# Patient Record
Sex: Male | Born: 1995 | Race: White | Hispanic: No | Marital: Single | Smoking: Never smoker
Health system: Southern US, Community
[De-identification: ages and names within clinical notes are randomized; demographics above are authoritative.]

## PROBLEM LIST (undated history)

## (undated) DIAGNOSIS — K219 Gastro-esophageal reflux disease without esophagitis: Secondary | ICD-10-CM

## (undated) HISTORY — PX: ADENOIDECTOMY: SHX5191

## (undated) HISTORY — DX: Gastro-esophageal reflux disease without esophagitis: K21.9

---
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
## 2014-03-11 ENCOUNTER — Telehealth: Payer: Self-pay | Admitting: Family Medicine

## 2014-03-15 NOTE — Telephone Encounter (Signed)
Several attempts made to contact patient.

## 2014-03-21 ENCOUNTER — Ambulatory Visit: Payer: Self-pay | Admitting: Family Medicine

## 2014-10-30 ENCOUNTER — Ambulatory Visit (INDEPENDENT_AMBULATORY_CARE_PROVIDER_SITE_OTHER): Payer: 59 | Admitting: Family

## 2014-10-30 ENCOUNTER — Encounter: Payer: Self-pay | Admitting: Family

## 2014-10-30 VITALS — BP 121/69 | HR 97 | Temp 98.3°F | Ht 69.0 in | Wt 189.6 lb

## 2014-10-30 DIAGNOSIS — K279 Peptic ulcer, site unspecified, unspecified as acute or chronic, without hemorrhage or perforation: Secondary | ICD-10-CM | POA: Diagnosis not present

## 2014-10-30 DIAGNOSIS — R1013 Epigastric pain: Secondary | ICD-10-CM | POA: Diagnosis not present

## 2014-10-30 MED ORDER — OMEPRAZOLE 40 MG PO CPDR: 40.0000 mg | DELAYED_RELEASE_CAPSULE | Freq: Every day | ORAL | Status: DC

## 2014-10-30 NOTE — Patient Instructions (Signed)
Peptic Ulcer A peptic ulcer is a sore in the lining of your esophagus (esophageal ulcer), stomach (gastric ulcer), or in the first part of your small intestine (duodenal ulcer). The ulcer causes erosion into the deeper tissue. CAUSES  Normally, the lining of the stomach and the small intestine protects itself from the acid that digests food. The protective lining can be damaged by:  An infection caused by a bacterium called Helicobacter pylori (H. pylori).  Regular use of nonsteroidal anti-inflammatory drugs (NSAIDs), such as ibuprofen or aspirin.  Smoking tobacco. Other risk factors include being older than 50, drinking alcohol excessively, and having a family history of ulcer disease.  SYMPTOMS   Burning pain or gnawing in the area between the chest and the belly button.  Heartburn.  Nausea and vomiting.  Bloating. The pain can be worse on an empty stomach and at night. If the ulcer results in bleeding, it can cause:  Black, tarry stools.  Vomiting of bright red blood.  Vomiting of coffee-ground-looking materials. DIAGNOSIS  A diagnosis is usually made based upon your history and an exam. Other tests and procedures may be performed to find the cause of the ulcer. Finding a cause will help determine the best treatment. Tests and procedures may include:  Blood tests, stool tests, or breath tests to check for the bacterium H. pylori.  An upper gastrointestinal (GI) series of the esophagus, stomach, and small intestine.  An endoscopy to examine the esophagus, stomach, and small intestine.  A biopsy. TREATMENT  Treatment may include:  Eliminating the cause of the ulcer, such as smoking, NSAIDs, or alcohol.  Medicines to reduce the amount of acid in your digestive tract.  Antibiotic medicines if the ulcer is caused by the H. pylori bacterium.  An upper endoscopy to treat a bleeding ulcer.  Surgery if the bleeding is severe or if the ulcer created a hole somewhere in the  digestive system. HOME CARE INSTRUCTIONS   Avoid tobacco, alcohol, and caffeine. Smoking can increase the acid in the stomach, and continued smoking will impair the healing of ulcers.  Avoid foods and drinks that seem to cause discomfort or aggravate your ulcer.  Only take medicines as directed by your caregiver. Do not substitute over-the-counter medicines for prescription medicines without talking to your caregiver.  Keep any follow-up appointments and tests as directed. SEEK MEDICAL CARE IF:   Your do not improve within 7 days of starting treatment.  You have ongoing indigestion or heartburn. SEEK IMMEDIATE MEDICAL CARE IF:   You have sudden, sharp, or persistent abdominal pain.  You have bloody or dark black, tarry stools.  You vomit blood or vomit that looks like coffee grounds.  You become light-headed, weak, or feel faint.  You become sweaty or clammy. MAKE SURE YOU:   Understand these instructions.  Will watch your condition.  Will get help right away if you are not doing well or get worse. Document Released: 07/09/2000 Document Revised: 11/26/2013 Document Reviewed: 02/09/2012 [facility] Patient Information 2015 [place], [place]. This information is not intended to replace advice given to you by your health care provider. Make sure you discuss any questions you have with your health care provider.  Food Choices for Peptic Ulcer Disease When you have peptic ulcer disease, the foods you eat and your eating habits are very important. Choosing the right foods can help ease the discomfort of peptic ulcer disease. WHAT GENERAL GUIDELINES DO I NEED TO FOLLOW?  Choose fruits, vegetables, whole grains, and low-fat meat,  fish, and poultry.   Keep a food diary to identify foods that cause symptoms.  Avoid foods that cause irritation or pain. These may be different for different people.  Eat frequent small meals instead of three large meals each day. The pain may be worse  when your stomach is empty.  Avoid eating close to bedtime. WHAT FOODS ARE NOT RECOMMENDED? The following are some foods and drinks that may worsen your symptoms:  Black, white, and red pepper.  Hot sauce.  Chili peppers.  Chili powder.  Chocolate and cocoa.   Alcohol.  Tea, coffee, and cola (regular and decaffeinated). The items listed above may not be a complete list of foods and beverages to avoid. Contact your dietitian for more information. Document Released: 10/04/2011 Document Revised: 07/17/2013 Document Reviewed: 05/16/2013 [facility] Patient Information 2015 [place], [place]. This information is not intended to replace advice given to you by your health care provider. Make sure you discuss any questions you have with your health care provider.  Peptic Ulcer A peptic ulcer is a sore in the lining of your esophagus (esophageal ulcer), stomach (gastric ulcer), or in the first part of your small intestine (duodenal ulcer). The ulcer causes erosion into the deeper tissue. CAUSES  Normally, the lining of the stomach and the small intestine protects itself from the acid that digests food. The protective lining can be damaged by:  An infection caused by a bacterium called Helicobacter pylori (H. pylori).  Regular use of nonsteroidal anti-inflammatory drugs (NSAIDs), such as ibuprofen or aspirin.  Smoking tobacco. Other risk factors include being older than 50, drinking alcohol excessively, and having a family history of ulcer disease.  SYMPTOMS   Burning pain or gnawing in the area between the chest and the belly button.  Heartburn.  Nausea and vomiting.  Bloating. The pain can be worse on an empty stomach and at night. If the ulcer results in bleeding, it can cause:  Black, tarry stools.  Vomiting of bright red blood.  Vomiting of coffee-ground-looking materials. DIAGNOSIS  A diagnosis is usually made based upon your history and an exam. Other tests and  procedures may be performed to find the cause of the ulcer. Finding a cause will help determine the best treatment. Tests and procedures may include:  Blood tests, stool tests, or breath tests to check for the bacterium H. pylori.  An upper gastrointestinal (GI) series of the esophagus, stomach, and small intestine.  An endoscopy to examine the esophagus, stomach, and small intestine.  A biopsy. TREATMENT  Treatment may include:  Eliminating the cause of the ulcer, such as smoking, NSAIDs, or alcohol.  Medicines to reduce the amount of acid in your digestive tract.  Antibiotic medicines if the ulcer is caused by the H. pylori bacterium.  An upper endoscopy to treat a bleeding ulcer.  Surgery if the bleeding is severe or if the ulcer created a hole somewhere in the digestive system. HOME CARE INSTRUCTIONS   Avoid tobacco, alcohol, and caffeine. Smoking can increase the acid in the stomach, and continued smoking will impair the healing of ulcers.  Avoid foods and drinks that seem to cause discomfort or aggravate your ulcer.  Only take medicines as directed by your caregiver. Do not substitute over-the-counter medicines for prescription medicines without talking to your caregiver.  Keep any follow-up appointments and tests as directed. SEEK MEDICAL CARE IF:   Your do not improve within 7 days of starting treatment.  You have ongoing indigestion or heartburn. SEEK IMMEDIATE  MEDICAL CARE IF:   You have sudden, sharp, or persistent abdominal pain.  You have bloody or dark black, tarry stools.  You vomit blood or vomit that looks like coffee grounds.  You become light-headed, weak, or feel faint.  You become sweaty or clammy. MAKE SURE YOU:   Understand these instructions.  Will watch your condition.  Will get help right away if you are not doing well or get worse. Document Released: 07/09/2000 Document Revised: 11/26/2013 Document Reviewed: 02/09/2012 [facility] Patient  Information 2015 [place], [place]. This information is not intended to replace advice given to you by your health care provider. Make sure you discuss any questions you have with your health care provider.

## 2014-10-30 NOTE — Progress Notes (Signed)
   Subjective:    Patient ID: Charles Greene, male    DOB: September 23, 1995, 19 y.o.   MRN: [id number]  Abdominal Pain This is a new problem. The current episode started in the past 7 days. The onset quality is gradual. The problem occurs intermittently. The problem has been waxing and waning. The pain is located in the epigastric region. The pain is at a severity of 8/10. The pain is moderate. The quality of the pain is sharp. The abdominal pain does not radiate. Pertinent negatives include no belching, constipation, diarrhea, dysuria, headaches, hematochezia, nausea or vomiting. The pain is aggravated by eating. The pain is relieved by nothing. He has tried antacids for the symptoms. The treatment provided no relief. There is no history of GERD.      Review of Systems  Constitutional: Negative.   HENT: Negative.   Respiratory: Negative.   Cardiovascular: Negative.   Gastrointestinal: Positive for abdominal pain. Negative for nausea, vomiting, diarrhea, constipation and hematochezia.  Endocrine: Negative.   Genitourinary: Negative.  Negative for dysuria.  Musculoskeletal: Negative.   Neurological: Negative.  Negative for headaches.  Hematological: Negative.   Psychiatric/Behavioral: Negative.   All other systems reviewed and are negative.      Objective:   Physical Exam  Constitutional: He is oriented to person, place, and time. He appears well-developed and well-nourished. No distress.  HENT:  Head: Normocephalic.  Right Ear: External ear normal.  Left Ear: External ear normal.  Nose: Nose normal.  Mouth/Throat: Oropharynx is clear and moist.  Eyes: Pupils are equal, round, and reactive to light. Right eye exhibits no discharge. Left eye exhibits no discharge.  Neck: Normal range of motion. Neck supple. No thyromegaly present.  Cardiovascular: Normal rate, regular rhythm, normal heart sounds and intact distal pulses.   No murmur heard. Pulmonary/Chest: Effort normal and breath  sounds normal. No respiratory distress. He has no wheezes.  Abdominal: Soft. Bowel sounds are normal. He exhibits no distension. There is no tenderness.  Musculoskeletal: Normal range of motion. He exhibits no edema or tenderness.  Neurological: He is alert and oriented to person, place, and time. He has normal reflexes. No cranial nerve deficit.  Skin: Skin is warm and dry. No rash noted. No erythema.  Psychiatric: He has a normal mood and affect. His behavior is normal. Judgment and thought content normal.  Vitals reviewed.   BP 121/69 mmHg  Pulse 97  Temp(Src) 98.3 F (36.8 C) (Oral)  Ht _0  (1.753 m)  Wt 189 lb 9.6 oz (86.002 kg)  BMI 27.99 kg/m2       Assessment & Plan:  1. Abdominal pain, epigastric - CMP14+EGFR - H Pylori, IGM, IGG, IGA AB  2. Peptic ulcer disease -Food choices and diet discussed -Labs pending -Avoid NSAIDS and alcohol -RTO prn - omeprazole (PRILOSEC) 40 MG capsule; Take 1 capsule (40 mg total) by mouth daily.  Dispense: 90 capsule; Refill: [place], FNP

## 2014-11-01 ENCOUNTER — Other Ambulatory Visit: Payer: Self-pay | Admitting: Family

## 2014-11-01 DIAGNOSIS — A048 Other specified bacterial intestinal infections: Secondary | ICD-10-CM

## 2014-11-01 LAB — CMP14+EGFR
ALT: 20 IU/L (ref 0–44)
AST: 17 IU/L (ref 0–40)
Albumin/Globulin Ratio: 1.7 (ref 1.1–2.5)
Albumin: 4.3 g/dL (ref 3.5–5.5)
Alkaline Phosphatase: 62 IU/L (ref 39–117)
BUN/Creatinine Ratio: 10 (ref 8–19)
BUN: 11 mg/dL (ref 6–20)
Bilirubin Total: 0.5 mg/dL (ref 0.0–1.2)
CALCIUM: 9.1 mg/dL (ref 8.7–10.2)
CHLORIDE: 97 mmol/L (ref 97–108)
CO2: 24 mmol/L (ref 18–29)
CREATININE: 1.05 mg/dL (ref 0.76–1.27)
GFR calc Af Amer: 118 mL/min/{1.73_m2} (ref >59)
GFR calc non Af Amer: 102 mL/min/{1.73_m2} (ref >59)
GLUCOSE: 84 mg/dL (ref 65–99)
Globulin, Total: 2.6 g/dL (ref 1.5–4.5)
Potassium: 4.1 mmol/L (ref 3.5–5.2)
Sodium: 138 mmol/L (ref 134–144)
Total Protein: 6.9 g/dL (ref 6.0–8.5)

## 2014-11-01 LAB — H PYLORI, IGM, IGG, IGA AB
H Pylori IgG: <0.9 U/mL (ref 0.0–0.8)
H. PYLORI, IGM ABS: 10.9 U — AB (ref 0.0–8.9)
H. pylori, IgA Abs: <9 units (ref 0.0–8.9)

## 2014-11-01 MED ORDER — METRONIDAZOLE 500 MG PO TABS
500.0000 mg | ORAL_TABLET | Freq: Four times a day (QID) | ORAL | Status: DC
Start: 2014-11-01 — End: 2015-12-15

## 2014-11-01 MED ORDER — TETRACYCLINE HCL 500 MG PO CAPS: 500.0000 mg | ORAL_CAPSULE | Freq: Four times a day (QID) | ORAL | Status: DC

## 2014-11-01 MED ORDER — BISMUTH SUBSALICYLATE 525 MG/15ML PO SUSP: 15.0000 mL | Freq: Four times a day (QID) | ORAL | Status: DC

## 2014-11-15 ENCOUNTER — Ambulatory Visit (INDEPENDENT_AMBULATORY_CARE_PROVIDER_SITE_OTHER): Payer: 59 | Admitting: Family

## 2014-11-15 ENCOUNTER — Encounter: Payer: Self-pay | Admitting: Family

## 2014-11-15 VITALS — BP 123/71 | HR 62 | Temp 97.8°F | Resp 18 | Wt 180.0 lb

## 2014-11-15 DIAGNOSIS — K279 Peptic ulcer, site unspecified, unspecified as acute or chronic, without hemorrhage or perforation: Secondary | ICD-10-CM

## 2014-11-15 DIAGNOSIS — B9681 Helicobacter pylori [H. pylori] as the cause of diseases classified elsewhere: Secondary | ICD-10-CM | POA: Diagnosis not present

## 2014-11-15 DIAGNOSIS — A048 Other specified bacterial intestinal infections: Secondary | ICD-10-CM

## 2014-11-15 MED ORDER — OMEPRAZOLE 40 MG PO CPDR: 40.0000 mg | DELAYED_RELEASE_CAPSULE | Freq: Every day | ORAL | Status: DC

## 2014-11-15 NOTE — Progress Notes (Signed)
Subjective:    Patient ID: Charles Greene, male    DOB: Nov 18, 1995, 19 y.o.   MRN: [id number]  HPI Pt presents to the office today for follow up on abd pain. Pt was diagnosed with H pylori. Pt was treated with flagyn, tetracycline and started on omeprazole. PT states he finished the antibiotics, but has stopped the omeprazole because he thought he was finished with that. Pt states he is having some nausea and had one episode of vomiting last night. Pt states it was not a large amount but it was a brownish orange color. Pt states  Pt states he is still having intermittent epigastric pain of a 8 out 10. Pt denies any blood in urine, stool or emesis.    Review of Systems  Constitutional: Negative.   HENT: Negative.   Respiratory: Negative.   Cardiovascular: Negative.   Gastrointestinal: Negative.   Endocrine: Negative.   Genitourinary: Negative.   Musculoskeletal: Negative.   Neurological: Negative.   Hematological: Negative.   Psychiatric/Behavioral: Negative.   All other systems reviewed and are negative.      Objective:   Physical Exam  Constitutional: He is oriented to person, place, and time. He appears well-developed and well-nourished. No distress.  HENT:  Head: Normocephalic.  Eyes: Pupils are equal, round, and reactive to light. Right eye exhibits no discharge. Left eye exhibits no discharge.  Neck: Normal range of motion. Neck supple. No thyromegaly present.  Cardiovascular: Normal rate, regular rhythm, normal heart sounds and intact distal pulses.   No murmur heard. Pulmonary/Chest: Effort normal and breath sounds normal. No respiratory distress. He has no wheezes.  Abdominal: Soft. Bowel sounds are normal. He exhibits no distension. There is tenderness (Mild LUQ pain).  Musculoskeletal: Normal range of motion. He exhibits no edema or tenderness.  Neurological: He is alert and oriented to person, place, and time. He has normal reflexes. No cranial nerve deficit.    Skin: Skin is warm and dry. No rash noted. No erythema.  Psychiatric: He has a normal mood and affect. His behavior is normal. Judgment and thought content normal.  Vitals reviewed.   BP 123/71 mmHg  Pulse 62  Temp(Src) 97.8 F (36.6 C)  Resp 18  Wt 180 lb (81.647 kg)       Assessment & Plan:  1. H. pylori infection - CMP14+EGFR - H Pylori, IGM, IGG, IGA AB  2. Peptic ulcer disease - CMP14+EGFR - H Pylori, IGM, IGG, IGA AB - omeprazole (PRILOSEC) 40 MG capsule; Take 1 capsule (40 mg total) by mouth daily.  Dispense: 90 capsule; Refill: 3  Food choices for peptic ulcer disease dicussed  Pt to continue taking Omeprazole daily RTO 2 weeks to recheck  Evelina Dun, FNP

## 2014-11-15 NOTE — Patient Instructions (Addendum)
Peptic Ulcer A peptic ulcer is a sore in the lining of your esophagus (esophageal ulcer), stomach (gastric ulcer), or in the first part of your small intestine (duodenal ulcer). The ulcer causes erosion into the deeper tissue. CAUSES  Normally, the lining of the stomach and the small intestine protects itself from the acid that digests food. The protective lining can be damaged by:  An infection caused by a bacterium called Helicobacter pylori (H. pylori).  Regular use of nonsteroidal anti-inflammatory drugs (NSAIDs), such as ibuprofen or aspirin.  Smoking tobacco. Other risk factors include being older than 50, drinking alcohol excessively, and having a family history of ulcer disease.  SYMPTOMS   Burning pain or gnawing in the area between the chest and the belly button.  Heartburn.  Nausea and vomiting.  Bloating. The pain can be worse on an empty stomach and at night. If the ulcer results in bleeding, it can cause:  Black, tarry stools.  Vomiting of bright red blood.  Vomiting of coffee-ground-looking materials. DIAGNOSIS  A diagnosis is usually made based upon your history and an exam. Other tests and procedures may be performed to find the cause of the ulcer. Finding a cause will help determine the best treatment. Tests and procedures may include:  Blood tests, stool tests, or breath tests to check for the bacterium H. pylori.  An upper gastrointestinal (GI) series of the esophagus, stomach, and small intestine.  An endoscopy to examine the esophagus, stomach, and small intestine.  A biopsy. TREATMENT  Treatment may include:  Eliminating the cause of the ulcer, such as smoking, NSAIDs, or alcohol.  Medicines to reduce the amount of acid in your digestive tract.  Antibiotic medicines if the ulcer is caused by the H. pylori bacterium.  An upper endoscopy to treat a bleeding ulcer.  Surgery if the bleeding is severe or if the ulcer created a hole somewhere in the  digestive system. HOME CARE INSTRUCTIONS   Avoid tobacco, alcohol, and caffeine. Smoking can increase the acid in the stomach, and continued smoking will impair the healing of ulcers.  Avoid foods and drinks that seem to cause discomfort or aggravate your ulcer.  Only take medicines as directed by your caregiver. Do not substitute over-the-counter medicines for prescription medicines without talking to your caregiver.  Keep any follow-up appointments and tests as directed. SEEK MEDICAL CARE IF:   Your do not improve within 7 days of starting treatment.  You have ongoing indigestion or heartburn. SEEK IMMEDIATE MEDICAL CARE IF:   You have sudden, sharp, or persistent abdominal pain.  You have bloody or dark black, tarry stools.  You vomit blood or vomit that looks like coffee grounds.  You become light-headed, weak, or feel faint.  You become sweaty or clammy. MAKE SURE YOU:   Understand these instructions.  Will watch your condition.  Will get help right away if you are not doing well or get worse. Document Released: 07/09/2000 Document Revised: 11/26/2013 Document Reviewed: 02/09/2012 [facility] Patient Information 2015 [place], [place]. This information is not intended to replace advice given to you by your health care provider. Make sure you discuss any questions you have with your health care provider. Helicobacter Pylori Antibodies Test This is a blood test which looks for a germ called Helicobacter pylori. This can also be diagnosed by a breath test or a microscopic examination of a portion (biopsy) of the small bowel. H. pylori is a germ that is found in the cells that line the stomach. It  is a risk factor for stomach and small bowel ulcers, long-standing inflammation of the lining of the stomach, or even ulcers that may occur in the esophagus (the canal that runs from the mouth to the stomach). This bacterium is also a factor in stomach cancer. The amount of the bacteria is  found in about 10% of healthy persons younger than 21 years of age and the amount of the bacteria increases with age. Most persons with these bacteria have no symptoms; however, it is thought that when these bacteria cause ulcers, antibiotic medications can be used to help eliminate or reduce the problem.  PREPARATION FOR TEST No preparation or fasting is necessary. NORMAL FINDINGS Negative (H. pylori bacteria not present). Ranges for normal findings may vary among different laboratories and hospitals. You should always check with your doctor after having lab work or other tests done to discuss the meaning of your test results and whether or not your values are considered within normal limits. MEANING OF TEST  Your caregiver will go over the test results with you and discuss the importance and meaning of your results, as well as treatment options and the need for additional tests if necessary. OBTAINING THE TEST RESULTS It is your responsibility to obtain your test results. Ask the lab or department performing the test when and how you will get your results. Document Released: 08/05/2004 Document Revised: 11/26/2013 Document Reviewed: 06/22/2008 [facility] Patient Information 2015 [place], [place]. This information is not intended to replace advice given to you by your health care provider. Make sure you discuss any questions you have with your health care provider. Food Choices for Peptic Ulcer Disease When you have peptic ulcer disease, the foods you eat and your eating habits are very important. Choosing the right foods can help ease the discomfort of peptic ulcer disease. WHAT GENERAL GUIDELINES DO I NEED TO FOLLOW?  Choose fruits, vegetables, whole grains, and low-fat meat, fish, and poultry.   Keep a food diary to identify foods that cause symptoms.  Avoid foods that cause irritation or pain. These may be different for different people.  Eat frequent small meals instead of three large meals  each day. The pain may be worse when your stomach is empty.  Avoid eating close to bedtime. WHAT FOODS ARE NOT RECOMMENDED? The following are some foods and drinks that may worsen your symptoms:  Black, white, and red pepper.  Hot sauce.  Chili peppers.  Chili powder.  Chocolate and cocoa.   Alcohol.  Tea, coffee, and cola (regular and decaffeinated). The items listed above may not be a complete list of foods and beverages to avoid. Contact your dietitian for more information. Document Released: 10/04/2011 Document Revised: 07/17/2013 Document Reviewed: 05/16/2013 [facility] Patient Information 2015 [place], [place]. This information is not intended to replace advice given to you by your health care provider. Make sure you discuss any questions you have with your health care provider.

## 2014-11-19 LAB — CMP14+EGFR
ALBUMIN: 4.6 g/dL (ref 3.5–5.5)
ALK PHOS: 60 IU/L (ref 39–117)
ALT: 30 IU/L (ref 0–44)
AST: 25 IU/L (ref 0–40)
Albumin/Globulin Ratio: 1.9 (ref 1.1–2.5)
BUN/Creatinine Ratio: 12 (ref 8–19)
BUN: 14 mg/dL (ref 6–20)
Bilirubin Total: 0.6 mg/dL (ref 0.0–1.2)
CALCIUM: 9.6 mg/dL (ref 8.7–10.2)
CHLORIDE: 99 mmol/L (ref 97–108)
CO2: 26 mmol/L (ref 18–29)
CREATININE: 1.15 mg/dL (ref 0.76–1.27)
GFR calc non Af Amer: 92 mL/min/{1.73_m2} (ref >59)
GFR, EST AFRICAN AMERICAN: 106 mL/min/{1.73_m2} (ref >59)
GLOBULIN, TOTAL: 2.4 g/dL (ref 1.5–4.5)
Glucose: 97 mg/dL (ref 65–99)
POTASSIUM: 4.8 mmol/L (ref 3.5–5.2)
SODIUM: 140 mmol/L (ref 134–144)
Total Protein: 7 g/dL (ref 6.0–8.5)

## 2014-11-19 LAB — H PYLORI, IGM, IGG, IGA AB: H. pylori, IgM Abs: <9 units (ref 0.0–8.9)

## 2015-12-15 ENCOUNTER — Encounter: Payer: Self-pay | Admitting: Physician Assistant

## 2015-12-15 ENCOUNTER — Ambulatory Visit (INDEPENDENT_AMBULATORY_CARE_PROVIDER_SITE_OTHER): Payer: BLUE CROSS/BLUE SHIELD | Admitting: Physician Assistant

## 2015-12-15 VITALS — BP 125/67 | HR 83 | Temp 97.9°F | Ht 69.0 in | Wt 200.6 lb

## 2015-12-15 DIAGNOSIS — K219 Gastro-esophageal reflux disease without esophagitis: Secondary | ICD-10-CM

## 2015-12-15 NOTE — Progress Notes (Signed)
Subjective:     Patient ID: Charles Greene Y Greene, male   DOB: 05-Nov-1995, 20 y.o.   MRN: [id number]  HPI Pt with epigastric pain for several days + hx of same No currently taking any meds for sx Hx of Hpylori and Prilosec use Denies ETOH use but does dip snuff and has caff intake Food seems to make sx worse  Review of Systems  Constitutional: Negative.   HENT: Negative.   Respiratory: Negative.   Gastrointestinal: Positive for nausea, abdominal pain and diarrhea. Negative for vomiting, constipation and blood in stool.       Objective:   Physical Exam  Constitutional: He appears well-developed and well-nourished.  HENT:  Mouth/Throat: Oropharynx is clear and moist.  Neck: Neck supple.  Cardiovascular: Normal rate, regular rhythm and normal heart sounds.   Pulmonary/Chest: Effort normal and breath sounds normal.  Abdominal: Soft. He exhibits no distension and no mass. There is no tenderness. There is no rebound and no guarding.  Lymphadenopathy:    He has no cervical adenopathy.  Nursing note and vitals reviewed.      Assessment:     1. Gastroesophageal reflux disease, esophagitis presence not specified        Plan:     Decrease caff intake Cut snuff use Hold any OTC NSAIDS OTC Zantac If sx continue f/u

## 2015-12-15 NOTE — Patient Instructions (Signed)

## 2016-05-31 ENCOUNTER — Encounter (HOSPITAL_COMMUNITY): Payer: Self-pay | Admitting: Emergency Medicine

## 2016-05-31 ENCOUNTER — Ambulatory Visit (HOSPITAL_COMMUNITY)
Admission: EM | Admit: 2016-05-31 | Discharge: 2016-05-31 | Disposition: A | Discharge status: Home / Self Care | Payer: Commercial Managed Care - HMO | Attending: Family Medicine | Admitting: Family Medicine

## 2016-05-31 DIAGNOSIS — R0789 Other chest pain: Secondary | ICD-10-CM

## 2016-05-31 MED ORDER — MELOXICAM 7.5 MG PO TABS: 7.5000 mg | ORAL_TABLET | Freq: Two times a day (BID) | ORAL | 1 refills | Status: DC

## 2016-05-31 NOTE — ED Provider Notes (Signed)
[facility]    CSN: [id number] Arrival date & time: 05/31/16  1806     History   Chief Complaint Chief Complaint  Patient presents with  . Chest Pain    HPI Charles Greene is a 20 y.o. male.   The history is provided by the patient and a parent.  Chest Pain  Pain location:  R chest Pain quality: sharp and stabbing   Pain radiates to:  Does not radiate Pain severity:  Mild Onset quality:  Sudden Progression:  Unchanged Chronicity:  New Context: movement   Context comment:  Moving equip at work and laid down to continue job and pain developed, continues. Relieved by:  None tried Worsened by:  Movement and deep breathing Ineffective treatments:  None tried Associated symptoms: no abdominal pain, no cough, no fever and no palpitations     History reviewed. No pertinent past medical history.  Patient Active Problem List   Diagnosis Date Noted  . H. pylori infection 11/15/2014    History reviewed. No pertinent surgical history.     Home Medications    Prior to Admission medications   Medication Sig Start Date End Date Taking? Authorizing Provider  omeprazole (PRILOSEC) 40 MG capsule Take 1 capsule (40 mg total) by mouth daily. Patient not taking: Reported on 12/15/2015 11/15/14   Junie Spencerhristy A Hawks, FNP    Family History History reviewed. No pertinent family history.  Social History Social History  Substance Use Topics  . Smoking status: Never Smoker  . Smokeless tobacco: Current User    Types: Chew  . Alcohol use No     Allergies   Patient has no known allergies.   Review of Systems Review of Systems  Constitutional: Negative.  Negative for fever.  HENT: Negative.   Respiratory: Negative.  Negative for cough.   Cardiovascular: Positive for chest pain. Negative for palpitations and leg swelling.  Gastrointestinal: Negative.  Negative for abdominal pain.  All other systems reviewed and are negative.    Physical Exam Triage Vital  Signs ED Triage Vitals  Enc Vitals Group     BP 05/31/16 1829 131/68     Pulse Rate 05/31/16 1829 80     Resp 05/31/16 1829 16     Temp 05/31/16 1829 99.1 F (37.3 C)     Temp Source 05/31/16 1829 Oral     SpO2 05/31/16 1829 100 %     Weight --      Height --      Head Circumference --      Peak Flow --      Pain Score 05/31/16 1844 6     Pain Loc --      Pain Edu? --      Excl. in GC? --    No data found.   Updated Vital Signs BP 131/68 (BP Location: Left Arm)   Pulse 80   Temp 99.1 F (37.3 C) (Oral)   Resp 16   SpO2 100%   Visual Acuity Right Eye Distance:   Left Eye Distance:   Bilateral Distance:    Right Eye Near:   Left Eye Near:    Bilateral Near:     Physical Exam  Constitutional: He is oriented to person, place, and time. He appears well-developed and well-nourished. He appears distressed.  Neck: Normal range of motion. Neck supple.  Cardiovascular: Normal rate, regular rhythm, normal heart sounds and intact distal pulses.   Pulmonary/Chest: Effort normal and breath sounds normal. He exhibits  tenderness.  Abdominal: Soft. Bowel sounds are normal.  Lymphadenopathy:    He has no cervical adenopathy.  Neurological: He is alert and oriented to person, place, and time. No cranial nerve deficit.  Skin: Skin is warm and dry.  Nursing note and vitals reviewed.    UC Treatments / Results  Labs (all labs ordered are listed, but only abnormal results are displayed) Labs Reviewed - No data to display  EKG ED ECG REPORT   Date: 05/31/2016  Rate: 82  Rhythm: normal sinus rhythm  QRS Axis: normal  Intervals: normal  ST/T Wave abnormalities: normal  Conduction Disutrbances:none  Narrative Interpretation:   Old EKG Reviewed: none available  I have personally reviewed the EKG tracing and agree with the computerized printout as noted.   Radiology No results found.  Procedures Procedures (including critical care time)  Medications Ordered in  UC Medications - No data to display   Initial Impression / Assessment and Plan / UC Course  I have reviewed the triage vital signs and the nursing notes.  Pertinent labs & imaging results that were available during my care of the patient were reviewed by me and considered in my medical decision making (see chart for details).  Clinical Course       Final Clinical Impressions(s) / UC Diagnoses   Final diagnoses:  None    New Prescriptions New Prescriptions   No medications on file     Linna HoffJames D Talley Kreiser, MD 05/31/16 [id number]

## 2016-05-31 NOTE — ED Triage Notes (Signed)
[facility] a [facility] of right sided chest pain that started at work today. The patient reported that he was moving a piece of equipment and after laying down on the ground he started to experience the chest pain. The patient denied any SOB and reported increased pain with movement and inspiration.

## 2016-09-15 DIAGNOSIS — J039 Acute tonsillitis, unspecified: Secondary | ICD-10-CM | POA: Diagnosis not present

## 2017-04-21 ENCOUNTER — Ambulatory Visit (INDEPENDENT_AMBULATORY_CARE_PROVIDER_SITE_OTHER): Payer: 59 | Admitting: Family

## 2017-04-21 ENCOUNTER — Encounter: Payer: Self-pay | Admitting: *Deleted

## 2017-04-21 ENCOUNTER — Encounter: Payer: Self-pay | Admitting: Family

## 2017-04-21 VITALS — BP 113/61 | HR 74 | Temp 98.6°F | Ht 69.0 in | Wt 201.0 lb

## 2017-04-21 DIAGNOSIS — E663 Overweight: Secondary | ICD-10-CM

## 2017-04-21 DIAGNOSIS — M5441 Lumbago with sciatica, right side: Secondary | ICD-10-CM

## 2017-04-21 MED ORDER — CYCLOBENZAPRINE HCL 5 MG PO TABS: 5.0000 mg | ORAL_TABLET | Freq: Three times a day (TID) | ORAL | 2 refills | Status: DC | PRN

## 2017-04-21 MED ORDER — NAPROXEN 500 MG PO TABS: 500.0000 mg | ORAL_TABLET | Freq: Two times a day (BID) | ORAL | 1 refills | Status: DC

## 2017-04-21 NOTE — Progress Notes (Signed)
   Subjective:    Patient ID: Charles Greene, male    DOB: July 20, 1996, 21 y.o.   MRN: [id number]  Back Pain  This is a new problem. The current episode started more than 1 month ago. The problem occurs intermittently. The problem has been waxing and waning since onset. The pain is present in the lumbar spine. The quality of the pain is described as aching. The pain radiates to the right thigh. The pain is at a severity of 8/10. The pain is moderate. Associated symptoms include leg pain. Pertinent negatives include no bladder incontinence, bowel incontinence, numbness or tingling. Risk factors include obesity. He has tried nothing for the symptoms. The treatment provided no relief.      Review of Systems  Gastrointestinal: Negative for bowel incontinence.  Genitourinary: Negative for bladder incontinence.  Musculoskeletal: Positive for back pain.  Neurological: Negative for tingling and numbness.  All other systems reviewed and are negative.      Objective:   Physical Exam  Constitutional: He is oriented to person, place, and time. He appears well-developed and well-nourished. No distress.  HENT:  Head: Normocephalic.  Eyes: Pupils are equal, round, and reactive to light. Right eye exhibits no discharge. Left eye exhibits no discharge.  Neck: Normal range of motion. Neck supple. No thyromegaly present.  Cardiovascular: Normal rate, regular rhythm, normal heart sounds and intact distal pulses.   No murmur heard. Pulmonary/Chest: Effort normal and breath sounds normal. No respiratory distress. He has no wheezes.  Abdominal: Soft. Bowel sounds are normal. He exhibits no distension. There is no tenderness.  Musculoskeletal: Normal range of motion. He exhibits no edema or tenderness.  Negative SLR, tenderness in lower lumbar with walking  Neurological: He is alert and oriented to person, place, and time.  Skin: Skin is warm and dry. No rash noted. No erythema.  Psychiatric: He has a  normal mood and affect. His behavior is normal. Judgment and thought content normal.  Vitals reviewed.     BP 113/61 (BP Location: Left Arm)   Pulse 74   Temp 98.6 F (37 C) (Oral)   Ht  (1.753 m)   Wt 201 lb (91.2 kg)   BMI 29.68 kg/m      Assessment & Plan:  1. Acute right-sided low back pain with right-sided sciatica Rest Ice  ROM exercises discussed Sedation precautions discussed RTO prn or if pain worsens or does not improve - naproxen (NAPROSYN) 500 MG tablet; Take 1 tablet (500 mg total) by mouth 2 (two) times daily with a meal.  Dispense: 60 tablet; Refill: 1 - cyclobenzaprine (FLEXERIL) 5 MG tablet; Take 1 tablet (5 mg total) by mouth 3 (three) times daily as needed for muscle spasms.  Dispense: 45 tablet; Refill: 2  2. Overweight (BMI 25.0-29.9)   Jannifer Rodney, FNP

## 2017-04-21 NOTE — Patient Instructions (Signed)
Sciatica Sciatica is pain, numbness, weakness, or tingling along the path of the sciatic nerve. The sciatic nerve starts in the lower back and runs down the back of each leg. The nerve controls the muscles in the lower leg and in the back of the knee. It also provides feeling (sensation) to the back of the thigh, the lower leg, and the sole of the foot. Sciatica is a symptom of another medical condition that pinches or puts pressure on the sciatic nerve. Generally, sciatica only affects one side of the body. Sciatica usually goes away on its own or with treatment. In some cases, sciatica may keep coming back (recur). What are the causes? This condition is caused by pressure on the sciatic nerve, or pinching of the sciatic nerve. This may be the result of:  A disk in between the bones of the spine (vertebrae) bulging out too far (herniated disk).  Age-related changes in the spinal disks (degenerative disk disease).  A pain disorder that affects a muscle in the buttock (piriformis syndrome).  Extra bone growth (bone spur) near the sciatic nerve.  An injury or break (fracture) of the pelvis.  Pregnancy.  Tumor (rare). What increases the risk? The following factors may make you more likely to develop this condition:  Playing sports that place pressure or stress on the spine, such as football or weight lifting.  Having poor strength and flexibility.  A history of back injury.  A history of back surgery.  Sitting for long periods of time.  Doing activities that involve repetitive bending or lifting.  Obesity. What are the signs or symptoms? Symptoms can vary from mild to very severe, and they may include:  Any of these problems in the lower back, leg, hip, or buttock:  Mild tingling or dull aches.  Burning sensations.  Sharp pains.  Numbness in the back of the calf or the sole of the foot.  Leg weakness.  Severe back pain that makes movement difficult. These symptoms may  get worse when you cough, sneeze, or laugh, or when you sit or stand for long periods of time. Being overweight may also make symptoms worse. In some cases, symptoms may recur over time. How is this diagnosed? This condition may be diagnosed based on:  Your symptoms.  A physical exam. Your health care provider may ask you to do certain movements to check whether those movements trigger your symptoms.  You may have tests, including:  Blood tests.  X-rays.  MRI.  CT scan. How is this treated? In many cases, this condition improves on its own, without any treatment. However, treatment may include:  Reducing or modifying physical activity during periods of pain.  Exercising and stretching to strengthen your abdomen and improve the flexibility of your spine.  Icing and applying heat to the affected area.  Medicines that help:  To relieve pain and swelling.  To relax your muscles.  Injections of medicines that help to relieve pain, irritation, and inflammation around the sciatic nerve (steroids).  Surgery. Follow these instructions at home: Medicines   Take over-the-counter and prescription medicines only as told by your health care provider.  Do not drive or operate heavy machinery while taking prescription pain medicine. Managing pain   If directed, apply ice to the affected area.  Put ice in a plastic bag.  Place a towel between your skin and the bag.  Leave the ice on for 20 minutes, 2-3 times a day.  After icing, apply heat to the   affected area before you exercise or as often as told by your health care provider. Use the heat source that your health care provider recommends, such as a moist heat pack or a heating pad.  Place a towel between your skin and the heat source.  Leave the heat on for 20-30 minutes.  Remove the heat if your skin turns bright red. This is especially important if you are unable to feel pain, heat, or cold. You may have a greater risk of  getting burned. Activity   Return to your normal activities as told by your health care provider. Ask your health care provider what activities are safe for you.  Avoid activities that make your symptoms worse.  Take brief periods of rest throughout the day. Resting in a lying or standing position is usually better than sitting to rest.  When you rest for longer periods, mix in some mild activity or stretching between periods of rest. This will help to prevent stiffness and pain.  Avoid sitting for long periods of time without moving. Get up and move around at least one time each hour.  Exercise and stretch regularly, as told by your health care provider.  Do not lift anything that is heavier than 10 lb (4.5 kg) while you have symptoms of sciatica. When you do not have symptoms, you should still avoid heavy lifting, especially repetitive heavy lifting.  When you lift objects, always use proper lifting technique, which includes:  Bending your knees.  Keeping the load close to your body.  Avoiding twisting. General instructions   Use good posture.  Avoid leaning forward while sitting.  Avoid hunching over while standing.  Maintain a healthy weight. Excess weight puts extra stress on your back and makes it difficult to maintain good posture.  Wear supportive, comfortable shoes. Avoid wearing high heels.  Avoid sleeping on a mattress that is too soft or too hard. A mattress that is firm enough to support your back when you sleep may help to reduce your pain.  Keep all follow-up visits as told by your health care provider. This is important. Contact a health care provider if:  You have pain that wakes you up when you are sleeping.  You have pain that gets worse when you lie down.  Your pain is worse than you have experienced in the past.  Your pain lasts longer than 4 weeks.  You experience unexplained weight loss. Get help right away if:  You lose control of your bowel  or bladder (incontinence).  You have:  Weakness in your lower back, pelvis, buttocks, or legs that gets worse.  Redness or swelling of your back.  A burning sensation when you urinate. This information is not intended to replace advice given to you by your health care provider. Make sure you discuss any questions you have with your health care provider. Document Released: 07/06/2001 Document Revised: 12/16/2015 Document Reviewed: 03/21/2015 Elsevier Interactive Patient Education  2017 [employer]  

## 2017-09-16 ENCOUNTER — Ambulatory Visit: Payer: 59 | Admitting: Physician Assistant

## 2017-09-19 ENCOUNTER — Encounter: Payer: Self-pay | Admitting: Family

## 2017-10-13 ENCOUNTER — Ambulatory Visit (INDEPENDENT_AMBULATORY_CARE_PROVIDER_SITE_OTHER): Payer: Worker's Compensation

## 2017-10-13 ENCOUNTER — Ambulatory Visit (HOSPITAL_COMMUNITY)
Admission: EM | Admit: 2017-10-13 | Discharge: 2017-10-13 | Disposition: A | Discharge status: Home / Self Care | Payer: Worker's Compensation | Attending: Family Medicine | Admitting: Family Medicine

## 2017-10-13 ENCOUNTER — Encounter (HOSPITAL_COMMUNITY): Payer: Self-pay | Admitting: Family Medicine

## 2017-10-13 DIAGNOSIS — S60221A Contusion of right hand, initial encounter: Secondary | ICD-10-CM

## 2017-10-13 MED ORDER — DICLOFENAC SODIUM 75 MG PO TBEC: 75.0000 mg | DELAYED_RELEASE_TABLET | Freq: Two times a day (BID) | ORAL | 0 refills | Status: DC

## 2017-10-13 NOTE — ED Triage Notes (Signed)
Pt here for right hand injury. sts that he injured it at work by hitting it on a hydraulic. Slight swelling to dorsal aspect of hand. He sts fingers are numb.

## 2017-10-13 NOTE — ED Provider Notes (Signed)
[facility]   [id number] 10/13/17 Arrival Time: 1733  ASSESSMENT & PLAN:  1. Contusion of right hand, initial encounter    Imaging: Dg Hand Complete Right  Result Date: 10/13/2017 CLINICAL DATA:  Pain post blunt trauma. EXAM: RIGHT HAND - COMPLETE 3+ VIEW COMPARISON:  None. FINDINGS: There is no evidence of fracture or dislocation. There is no evidence of arthropathy or other focal bone abnormality. Soft tissues are unremarkable. IMPRESSION: Negative. Electronically Signed   By: Ted Mcalpine M.D.   On: 10/13/2017 19:17   Meds ordered this encounter  Medications  . diclofenac (VOLTAREN) 75 MG EC tablet    Sig: Take 1 tablet (75 mg total) by mouth 2 (two) times daily.    Dispense:  14 tablet    Refill:  0   Work note given. To f/u with Occupational Health tomorrow; information given.  Reviewed expectations re: course of current medical issues. Questions answered. Outlined signs and symptoms indicating need for more acute intervention. Patient verbalized understanding. After Visit Summary given.  SUBJECTIVE: History from: patient. MATTHEWS FRANKS is a 22 y.o. male who reports persistent localized mild to moderate pain of his right dorsal hand that is stable; described as aching and dull without radiation. Onset: abrupt, today. Injury/trama: yes, reports hand slipped off of wrench at work and hit another piece of metal; immediate discomfort. Relieved by: holding hand still. Worsened by: certain movements. Associated symptoms: none reported. Extremity sensation changes or weakness: none. Self treatment: has not tried OTCs for relief of pain. History of similar: no  ROS: As per HPI.   OBJECTIVE:  Vitals:   10/13/17 1758  BP: (!) 122/52  Pulse: 62  Resp: 18  Temp: 98.2 F (36.8 C)  TempSrc: Oral  SpO2: 100%    General appearance: alert; no distress Extremities: no cyanosis or edema; symmetrical with no gross deformities; localized tenderness over  his dorsal right hand over 3rd metacarpal with mild swelling and no bruising; ROM: normal CV: normal extremity capillary refill Skin: warm and dry Neurologic: normal gait; normal symmetric reflexes in all extremities; normal sensation in all extremities Psychological: alert and cooperative; normal mood and affect  No Known Allergies   Social History   Socioeconomic History  . Marital status: Single    Spouse name: Not on file  . Number of children: Not on file  . Years of education: Not on file  . Highest education level: Not on file  Occupational History  . Not on file  Social Needs  . Financial resource strain: Not on file  . Food insecurity:    Worry: Not on file    Inability: Not on file  . Transportation needs:    Medical: Not on file    Non-medical: Not on file  Tobacco Use  . Smoking status: Never Smoker  . Smokeless tobacco: Current User    Types: Chew  Substance and Sexual Activity  . Alcohol use: No    Alcohol/week: 0.0 oz  . Drug use: No  . Sexual activity: Not on file  Lifestyle  . Physical activity:    Days per week: Not on file    Minutes per session: Not on file  . Stress: Not on file  Relationships  . Social connections:    Talks on phone: Not on file    Gets together: Not on file    Attends religious service: Not on file    Active member of club or organization: Not on file  Attends meetings of clubs or organizations: Not on file    Relationship status: Not on file  . Intimate partner violence:    Fear of current or ex partner: Not on file    Emotionally abused: Not on file    Physically abused: Not on file    Forced sexual activity: Not on file  Other Topics Concern  . Not on file  Social History Narrative  . Not on file    History reviewed. No pertinent surgical history.    Mardella LaymanHagler, Emma-Lee Oddo, MD 10/18/17 [phone]

## 2017-10-14 ENCOUNTER — Ambulatory Visit: Payer: Self-pay

## 2017-10-14 ENCOUNTER — Other Ambulatory Visit: Payer: Self-pay | Admitting: Occupational Medicine

## 2017-10-14 DIAGNOSIS — M79641 Pain in right hand: Secondary | ICD-10-CM

## 2017-11-21 ENCOUNTER — Ambulatory Visit: Payer: 59 | Admitting: Family

## 2017-11-21 ENCOUNTER — Encounter: Payer: Self-pay | Admitting: Family

## 2017-11-21 VITALS — BP 99/55 | HR 84 | Temp 97.2°F | Ht 69.0 in | Wt 175.8 lb

## 2017-11-21 DIAGNOSIS — H9193 Unspecified hearing loss, bilateral: Secondary | ICD-10-CM | POA: Diagnosis not present

## 2017-11-21 DIAGNOSIS — H66006 Acute suppurative otitis media without spontaneous rupture of ear drum, recurrent, bilateral: Secondary | ICD-10-CM | POA: Diagnosis not present

## 2017-11-21 MED ORDER — AMOXICILLIN-POT CLAVULANATE 875-125 MG PO TABS: 1.0000 | ORAL_TABLET | Freq: Two times a day (BID) | ORAL | 0 refills | Status: DC

## 2017-11-21 MED ORDER — FLUTICASONE PROPIONATE 50 MCG/ACT NA SUSP: 2.0000 | Freq: Every day | NASAL | 6 refills | Status: DC

## 2017-11-21 NOTE — Progress Notes (Signed)
   Subjective:    Patient ID: Charles Greene, male    DOB: [birth year], 22 y.o.   MRN: [id number]  HPI PT presents to the office today with complaints of decrease hearing. States he had a hearing test on 11/14/17 and his results had significantly decreased.   Denies any pain, ear discharge, or dizziness.   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 He reports he never wears any ear protection.   Review of Systems  HENT: Positive for hearing loss.   All other systems reviewed and are negative.      Objective:   Physical Exam  Constitutional: He is oriented to person, place, and time. He appears well-developed and well-nourished. No distress.  HENT:  Head: Normocephalic.  Right Ear: Tympanic membrane is erythematous and bulging. A middle ear effusion is present.  Left Ear: Tympanic membrane is erythematous and bulging. A middle ear effusion is present.  Nose: Mucosal edema and rhinorrhea present.  Mouth/Throat: Oropharynx is clear and moist.  Eyes: Pupils are equal, round, and reactive to light. Right eye exhibits no discharge. Left eye exhibits no discharge.  Neck: Normal range of motion. Neck supple. No thyromegaly present.  Cardiovascular: Normal rate, regular rhythm, normal heart sounds and intact distal pulses.  No murmur heard. Pulmonary/Chest: Effort normal and breath sounds normal. No respiratory distress. He has no wheezes.  Abdominal: Soft. Bowel sounds are normal. He exhibits no distension. There is no tenderness.  Musculoskeletal: Normal range of motion. He exhibits no edema or tenderness.  Neurological: He is alert and oriented to person, place, and time. He has normal reflexes. No cranial nerve deficit.  Skin: Skin is warm and dry. No rash noted. No erythema.  Psychiatric: He has a normal mood and affect. His behavior is normal. Judgment and thought content  normal.  Vitals reviewed.     BP (!) 99/55   Pulse 84   Temp (!) 97.2 F (36.2 C) (Oral)   Ht  (1.753 m)   Wt 175 lb 12.8 oz (79.7 kg)   BMI 25.96 kg/m      Assessment & Plan:  1. Recurrent acute suppurative otitis media without spontaneous rupture of tympanic membrane of both sides Take medication as prescribe and complete medication even if symptoms improve Keep clean and dry Start Flonase and zyrtec daily - amoxicillin-clavulanate (AUGMENTIN) 875-125 MG tablet; Take 1 tablet by mouth 2 (two) times daily.  Dispense: 14 tablet; Refill: 0 - fluticasone (FLONASE) 50 MCG/ACT nasal spray; Place 2 sprays into both nostrils daily.  Dispense: 16 g; Refill: 6 - Ambulatory referral to ENT  2. Decreased hearing of both ears - Ambulatory referral to ENT    Jannifer Rodney, FNP

## 2017-11-21 NOTE — Patient Instructions (Signed)

## 2018-09-21 DIAGNOSIS — H6983 Other specified disorders of Eustachian tube, bilateral: Secondary | ICD-10-CM | POA: Diagnosis not present

## 2018-09-21 DIAGNOSIS — H900 Conductive hearing loss, bilateral: Secondary | ICD-10-CM | POA: Diagnosis not present

## 2018-09-21 DIAGNOSIS — H906 Mixed conductive and sensorineural hearing loss, bilateral: Secondary | ICD-10-CM | POA: Diagnosis not present

## 2019-04-14 ENCOUNTER — Encounter (HOSPITAL_COMMUNITY): Payer: Self-pay | Admitting: Emergency Medicine

## 2019-04-14 ENCOUNTER — Other Ambulatory Visit: Payer: Self-pay

## 2019-04-14 ENCOUNTER — Emergency Department (HOSPITAL_COMMUNITY): Payer: 59

## 2019-04-14 ENCOUNTER — Emergency Department (HOSPITAL_COMMUNITY)
Admission: EM | Admit: 2019-04-14 | Discharge: 2019-04-15 | Disposition: A | Discharge status: Home / Self Care | Payer: 59 | Attending: Emergency Medicine | Admitting: Emergency Medicine

## 2019-04-14 DIAGNOSIS — Y929 Unspecified place or not applicable: Secondary | ICD-10-CM | POA: Diagnosis not present

## 2019-04-14 DIAGNOSIS — S01311A Laceration without foreign body of right ear, initial encounter: Secondary | ICD-10-CM | POA: Diagnosis not present

## 2019-04-14 DIAGNOSIS — Z79899 Other long term (current) drug therapy: Secondary | ICD-10-CM | POA: Insufficient documentation

## 2019-04-14 DIAGNOSIS — S0990XA Unspecified injury of head, initial encounter: Secondary | ICD-10-CM

## 2019-04-14 DIAGNOSIS — Y999 Unspecified external cause status: Secondary | ICD-10-CM | POA: Diagnosis not present

## 2019-04-14 DIAGNOSIS — Y93I9 Activity, other involving external motion: Secondary | ICD-10-CM | POA: Diagnosis not present

## 2019-04-14 MED ORDER — TETANUS-DIPHTH-ACELL PERTUSSIS 5-2.5-18.5 LF-MCG/0.5 IM SUSP
0.5000 mL | Freq: Once | INTRAMUSCULAR | Status: DC
  Filled 2019-04-14: qty 0.5

## 2019-04-14 MED ORDER — IBUPROFEN 800 MG PO TABS
800.0000 mg | ORAL_TABLET | Freq: Once | ORAL | Status: AC
  Administered 2019-04-15: 800 mg via ORAL
  Filled 2019-04-14: qty 1

## 2019-04-14 MED ORDER — CIPROFLOXACIN HCL 250 MG PO TABS
500.0000 mg | ORAL_TABLET | Freq: Once | ORAL | Status: AC
  Administered 2019-04-15: 500 mg via ORAL
  Filled 2019-04-14: qty 2

## 2019-04-14 MED ORDER — LIDOCAINE-EPINEPHRINE (PF) 2 %-1:200000 IJ SOLN
10.0000 mL | Freq: Once | INTRAMUSCULAR | Status: AC
  Administered 2019-04-14: 10 mL
  Filled 2019-04-14: qty 10

## 2019-04-14 MED ORDER — CIPROFLOXACIN HCL 500 MG PO TABS: 500.0000 mg | ORAL_TABLET | Freq: Two times a day (BID) | ORAL | 0 refills | Status: DC
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
## 2019-04-14 NOTE — Discharge Instructions (Signed)
Take ibuprofen 3 times a day with meals.  Do not take other anti-inflammatories at the same time (Advil, Motrin, naproxen, Aleve). You may supplement with Tylenol if you need further pain control. Use ice packs for pain and swelling of your ear.  Take antibiotics as prescribed.  Take the entire course, even if your symptoms improve. Follow-up with ear nose and throat doctor.  His information is listed below.  Call on Monday morning to set up a follow-up appointment. Wash daily with soap and water.  Otherwise, try and keep your ear clean. Do not go swimming in ocean, lakes, pools, creeks, or any other dirty water until stitches have been removed. Return to the ER with any new, worsening, or concerning symptoms.
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
## 2019-08-28 ENCOUNTER — Other Ambulatory Visit: Payer: Self-pay | Admitting: Nurse Practitioner

## 2019-08-28 ENCOUNTER — Ambulatory Visit
Admission: RE | Admit: 2019-08-28 | Discharge: 2019-08-28 | Disposition: A | Discharge status: Home / Self Care | Payer: 59 | Source: Ambulatory Visit | Attending: Nurse Practitioner | Admitting: Nurse Practitioner

## 2019-08-28 DIAGNOSIS — T1490XA Injury, unspecified, initial encounter: Secondary | ICD-10-CM
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
## 2021-01-05 DIAGNOSIS — M79642 Pain in left hand: Secondary | ICD-10-CM | POA: Diagnosis not present

## 2021-01-05 DIAGNOSIS — M79641 Pain in right hand: Secondary | ICD-10-CM | POA: Diagnosis not present

## 2021-01-05 DIAGNOSIS — T07XXXA Unspecified multiple injuries, initial encounter: Secondary | ICD-10-CM | POA: Diagnosis not present

## 2021-01-05 DIAGNOSIS — S61309A Unspecified open wound of unspecified finger with damage to nail, initial encounter: Secondary | ICD-10-CM | POA: Diagnosis not present

## 2021-01-05 DIAGNOSIS — M25532 Pain in left wrist: Secondary | ICD-10-CM | POA: Diagnosis not present

## 2021-01-05 DIAGNOSIS — M7989 Other specified soft tissue disorders: Secondary | ICD-10-CM | POA: Diagnosis not present

## 2021-06-24 ENCOUNTER — Ambulatory Visit: Payer: BC Managed Care – PPO | Admitting: Family Medicine

## 2021-06-24 ENCOUNTER — Encounter: Payer: Self-pay | Admitting: Family Medicine

## 2021-06-24 VITALS — BP 119/68 | HR 66 | Temp 97.0°F | Ht 70.0 in | Wt 236.1 lb

## 2021-06-24 DIAGNOSIS — R1319 Other dysphagia: Secondary | ICD-10-CM

## 2021-06-24 DIAGNOSIS — B353 Tinea pedis: Secondary | ICD-10-CM

## 2021-06-24 DIAGNOSIS — K219 Gastro-esophageal reflux disease without esophagitis: Secondary | ICD-10-CM | POA: Diagnosis not present

## 2021-06-24 DIAGNOSIS — K921 Melena: Secondary | ICD-10-CM | POA: Diagnosis not present

## 2021-06-24 DIAGNOSIS — K920 Hematemesis: Secondary | ICD-10-CM

## 2021-06-24 MED ORDER — CLOTRIMAZOLE-BETAMETHASONE 1-0.05 % EX CREA: 1.0000 "application " | TOPICAL_CREAM | Freq: Every day | CUTANEOUS | 0 refills | Status: DC

## 2021-06-24 MED ORDER — OMEPRAZOLE 40 MG PO CPDR: 40.0000 mg | DELAYED_RELEASE_CAPSULE | Freq: Every day | ORAL | 3 refills | Status: DC

## 2021-06-24 NOTE — Progress Notes (Signed)
New Patient Office Visit  Subjective:  Patient ID: Charles Greene, male    DOB: Dec 27, 1995  Age: 25 y.o. MRN: [id number]  CC:  Chief Complaint  Patient presents with   New Patient (Initial Visit)    HPI Charles Greene presents to establish care. He has some concerns today. He has 2 episodes of bloody emesis in the last month. He isn't sure how much blood was is in the vomit. These occurred about 1 week apart, with last occurrence last week. He has had difficulty swallowing for the last few months. This has happened mostly with food and sometimes liquids. It mostly happens with foods with bread. He does have some intermittent epigastric pain. He has regurgitation some times. He reports a history of acid reflex, mostly at night. He takes tums prn. He often feels some mild nausea. He noticed black, tarry stools 2 days ago. He hasn't paid attention since then. He does feel fatigued. He drinks alcohol heavily on the weekends. He denies fever, weight loss, diarrhea, or constipation.   History reviewed. No pertinent past medical history.  History reviewed. No pertinent surgical history.  Family History  Problem Relation Age of Onset   Diabetes Mother    Arthritis Father    Asthma Brother    Arthritis Maternal Grandmother    Kidney disease Maternal Grandfather    Diabetes Maternal Grandfather    Cancer Maternal Grandfather    Arthritis Maternal Grandfather    Stroke Paternal Grandmother    Cancer Paternal Grandfather     Social History   Socioeconomic History   Marital status: Single    Spouse name: Not on file   Number of children: 0   Years of education: 12   Highest education level: High school graduate  Occupational History   Not on file  Tobacco Use   Smoking status: Never   Smokeless tobacco: Current    Types: Chew  Vaping Use   Vaping Use: Some days  Substance and Sexual Activity   Alcohol use: Yes    Alcohol/week: 12.0 standard drinks    Types: 12 Cans of beer  per week    Comment: socially   Drug use: No   Sexual activity: Yes    Birth control/protection: None  Other Topics Concern   Not on file  Social History Narrative   Not on file   Social Determinants of Health   Financial Resource Strain: Not on file  Food Insecurity: Not on file  Transportation Needs: Not on file  Physical Activity: Not on file  Stress: Not on file  Social Connections: Not on file  Intimate Partner Violence: Not on file    ROS Review of Systems As per HPI.   Objective:   Today's Vitals: BP 119/68   Pulse 66   Temp (!) 97 F (36.1 C) (Temporal)   Ht 5\' 10"  (1.778 m)   Wt 236 lb 2 oz (107.1 kg)   BMI 33.88 kg/m   Physical Exam Vitals and nursing note reviewed.  Constitutional:      General: He is not in acute distress.    Appearance: He is not ill-appearing, toxic-appearing or diaphoretic.  Neck:     Thyroid: No thyroid mass, thyromegaly or thyroid tenderness.     Trachea: Trachea normal.  Cardiovascular:     Rate and Rhythm: Normal rate and regular rhythm.     Heart sounds: Normal heart sounds. No murmur heard. Pulmonary:     Effort: Pulmonary effort is  normal. No respiratory distress.     Breath sounds: Normal breath sounds.  Abdominal:     General: Bowel sounds are normal. There is no distension.     Palpations: Abdomen is soft.     Tenderness: There is abdominal tenderness in the epigastric area. There is no right CVA tenderness, left CVA tenderness, guarding or rebound. Negative signs include Murphy's sign and McBurney's sign.  Musculoskeletal:     Right lower leg: No edema.     Left lower leg: No edema.  Skin:    General: Skin is warm and dry.     Comments: Tinea pedis present  Neurological:     General: No focal deficit present.     Mental Status: He is alert and oriented to person, place, and time.  Psychiatric:        Mood and Affect: Mood normal.        Behavior: Behavior normal.    Assessment & Plan:   Robbert was seen  today for new patient (initial visit).  Diagnoses and all orders for this visit:  Gastroesophageal reflux disease, unspecified whether esophagitis present Esophageal dysphagia Hematemesis, unspecified whether nausea present Uncontrolled. Start prilosec. Discussed diet and avoidance of alcohol. Labs pending. Referral to GI placed.  -     Ambulatory referral to Gastroenterology -     omeprazole (PRILOSEC) 40 MG capsule; Take 1 capsule (40 mg total) by mouth daily. -     CBC with Differential/Platelet -     Basic Metabolic Panel -     Hepatic Function Panel  Black tarry stools Labs pending. Referral to GI placed.  -     Ambulatory referral to Gastroenterology -     CBC with Differential/Platelet -     Basic Metabolic Panel -     Hepatic Function Panel -     Fecal occult blood, imunochemical; Future  Tinea pedis of both feet Lotrisone ordered.  -     clotrimazole-betamethasone (LOTRISONE) cream; Apply 1 application topically daily.  Follow-up: Return in about 4 weeks (around 07/22/2021) for GERD follow up.  The patient indicates understanding of these issues and agrees with the plan.   Gabriel Earing, FNP

## 2021-06-24 NOTE — Patient Instructions (Signed)
Gastroesophageal Reflux Disease, Adult ?Gastroesophageal reflux (GER) happens when acid from the stomach flows up into the tube that connects the mouth and the stomach (esophagus). Normally, food travels down the esophagus and stays in the stomach to be digested. However, when a person has GER, food and stomach acid sometimes move back up into the esophagus. If this becomes a more serious problem, the person may be diagnosed with a disease called gastroesophageal reflux disease (GERD). GERD occurs when the reflux: ?Happens often. ?Causes frequent or severe symptoms. ?Causes problems such as damage to the esophagus. ?When stomach acid comes in contact with the esophagus, the acid may cause inflammation in the esophagus. Over time, GERD may create small holes (ulcers) in the lining of the esophagus. ?What are the causes? ?This condition is caused by a problem with the muscle between the esophagus and the stomach (lower esophageal sphincter, or LES). Normally, the LES muscle closes after food passes through the esophagus to the stomach. When the LES is weakened or abnormal, it does not close properly, and that allows food and stomach acid to go back up into the esophagus. ?The LES can be weakened by certain dietary substances, medicines, and medical conditions, including: ?Tobacco use. ?Pregnancy. ?Having a hiatal hernia. ?Alcohol use. ?Certain foods and beverages, such as coffee, chocolate, onions, and peppermint. ?What increases the risk? ?You are more likely to develop this condition if you: ?Have an increased body weight. ?Have a connective tissue disorder. ?Take NSAIDs, such as ibuprofen. ?What are the signs or symptoms? ?Symptoms of this condition include: ?Heartburn. ?Difficult or painful swallowing and the feeling of having a lump in the throat. ?A bitter taste in the mouth. ?Bad breath and having a large amount of saliva. ?Having an upset or bloated stomach and belching. ?Chest pain. Different conditions can  cause chest pain. Make sure you see your health care provider if you experience chest pain. ?Shortness of breath or wheezing. ?Ongoing (chronic) cough or a nighttime cough. ?Wearing away of tooth enamel. ?Weight loss. ?How is this diagnosed? ?This condition may be diagnosed based on a medical history and a physical exam. To determine if you have mild or severe GERD, your health care provider may also monitor how you respond to treatment. You may also have tests, including: ?A test to examine your stomach and esophagus with a small camera (endoscopy). ?A test that measures the acidity level in your esophagus. ?A test that measures how much pressure is on your esophagus. ?A barium swallow or modified barium swallow test to show the shape, size, and functioning of your esophagus. ?How is this treated? ?Treatment for this condition may vary depending on how severe your symptoms are. Your health care provider may recommend: ?Changes to your diet. ?Medicine. ?Surgery. ?The goal of treatment is to help relieve your symptoms and to prevent complications. ?Follow these instructions at home: ?Eating and drinking ? ?Follow a diet as recommended by your health care provider. This may involve avoiding foods and drinks such as: ?Coffee and tea, with or without caffeine. ?Drinks that contain alcohol. ?Energy drinks and sports drinks. ?Carbonated drinks or sodas. ?Chocolate and cocoa. ?Peppermint and mint flavorings. ?Garlic and onions. ?Horseradish. ?Spicy and acidic foods, including peppers, chili powder, curry powder, vinegar, hot sauces, and barbecue sauce. ?Citrus fruit juices and citrus fruits, such as oranges, lemons, and limes. ?Tomato-based foods, such as red sauce, chili, salsa, and pizza with red sauce. ?Fried and fatty foods, such as donuts, french fries, potato chips, and high-fat dressings. ?  High-fat meats, such as hot dogs and fatty cuts of red and white meats, such as rib eye steak, sausage, ham, and  bacon. ?High-fat dairy items, such as whole milk, butter, and cream cheese. ?Eat small, frequent meals instead of large meals. ?Avoid drinking large amounts of liquid with your meals. ?Avoid eating meals during the 2-3 hours before bedtime. ?Avoid lying down right after you eat. ?Do not exercise right after you eat. ?Lifestyle ? ?Do not use any products that contain nicotine or tobacco. These products include cigarettes, chewing tobacco, and vaping devices, such as e-cigarettes. If you need help quitting, ask your health care provider. ?Try to reduce your stress by using methods such as yoga or meditation. If you need help reducing stress, ask your health care provider. ?If you are overweight, reduce your weight to an amount that is healthy for you. Ask your health care provider for guidance about a safe weight loss goal. ?General instructions ?Pay attention to any changes in your symptoms. ?Take over-the-counter and prescription medicines only as told by your health care provider. Do not take aspirin, ibuprofen, or other NSAIDs unless your health care provider told you to take these medicines. ?Wear loose-fitting clothing. Do not wear anything tight around your waist that causes pressure on your abdomen. ?Raise (elevate) the head of your bed about 6 inches (15 cm). You can use a wedge to do this. ?Avoid bending over if this makes your symptoms worse. ?Keep all follow-up visits. This is important. ?Contact a health care provider if: ?You have: ?New symptoms. ?Unexplained weight loss. ?Difficulty swallowing or it hurts to swallow. ?Wheezing or a persistent cough. ?A hoarse voice. ?Your symptoms do not improve with treatment. ?Get help right away if: ?You have sudden pain in your arms, neck, jaw, teeth, or back. ?You suddenly feel sweaty, dizzy, or light-headed. ?You have chest pain or shortness of breath. ?You vomit and the vomit is green, yellow, or black, or it looks like blood or coffee grounds. ?You faint. ?You  have stool that is red, bloody, or black. ?You cannot swallow, drink, or eat. ?These symptoms may represent a serious problem that is an emergency. Do not wait to see if the symptoms will go away. Get medical help right away. Call your local emergency services (911 in the [place] Do not drive yourself to the hospital. ?Summary ?Gastroesophageal reflux happens when acid from the stomach flows up into the esophagus. GERD is a disease in which the reflux happens often, causes frequent or severe symptoms, or causes problems such as damage to the esophagus. ?Treatment for this condition may vary depending on how severe your symptoms are. Your health care provider may recommend diet and lifestyle changes, medicine, or surgery. ?Contact a health care provider if you have new or worsening symptoms. ?Take over-the-counter and prescription medicines only as told by your health care provider. Do not take aspirin, ibuprofen, or other NSAIDs unless your health care provider told you to do so. ?Keep all follow-up visits as told by your health care provider. This is important. ?This information is not intended to replace advice given to you by your health care provider. Make sure you discuss any questions you have with your health care provider. ?Document Revised: 01/21/2020 Document Reviewed: 01/21/2020 ?Elsevier Patient Education ? 2022 [employer] ? ?

## 2021-06-25 LAB — BASIC METABOLIC PANEL
BUN/Creatinine Ratio: 16 (ref 9–20)
BUN: 16 mg/dL (ref 6–20)
CO2: 24 mmol/L (ref 20–29)
Calcium: 9.7 mg/dL (ref 8.7–10.2)
Chloride: 103 mmol/L (ref 96–106)
Creatinine, Ser: 1.03 mg/dL (ref 0.76–1.27)
Glucose: 82 mg/dL (ref 70–99)
Potassium: 4.4 mmol/L (ref 3.5–5.2)
Sodium: 143 mmol/L (ref 134–144)
eGFR: 103 mL/min/{1.73_m2} (ref >59)

## 2021-06-25 LAB — CBC WITH DIFFERENTIAL/PLATELET
Basophils Absolute: 0.1 10*3/uL (ref 0.0–0.2)
Basos: 2 %
EOS (ABSOLUTE): 0.4 10*3/uL (ref 0.0–0.4)
Eos: 6 %
Hematocrit: 46.1 % (ref 37.5–51.0)
Hemoglobin: 16.3 g/dL (ref 13.0–17.7)
Immature Grans (Abs): 0.1 10*3/uL (ref 0.0–0.1)
Immature Granulocytes: 1 %
Lymphocytes Absolute: 2.2 10*3/uL (ref 0.7–3.1)
Lymphs: 30 %
MCH: 32.1 pg (ref 26.6–33.0)
MCHC: 35.4 g/dL (ref 31.5–35.7)
MCV: 91 fL (ref 79–97)
Monocytes Absolute: 0.6 10*3/uL (ref 0.1–0.9)
Monocytes: 8 %
Neutrophils Absolute: 4.1 10*3/uL (ref 1.4–7.0)
Neutrophils: 53 %
Platelets: 200 10*3/uL (ref 150–450)
RBC: 5.07 x10E6/uL (ref 4.14–5.80)
RDW: 12 % (ref 11.6–15.4)
WBC: 7.4 10*3/uL (ref 3.4–10.8)

## 2021-06-25 LAB — HEPATIC FUNCTION PANEL
ALT: 64 IU/L — ABNORMAL HIGH (ref 0–44)
AST: 27 IU/L (ref 0–40)
Albumin: 4.6 g/dL (ref 4.1–5.2)
Alkaline Phosphatase: 72 IU/L (ref 44–121)
Bilirubin Total: 0.5 mg/dL (ref 0.0–1.2)
Bilirubin, Direct: 0.15 mg/dL (ref 0.00–0.40)
Total Protein: 7.1 g/dL (ref 6.0–8.5)

## 2021-06-29 ENCOUNTER — Encounter: Payer: Self-pay | Admitting: Family Medicine

## 2021-06-29 ENCOUNTER — Ambulatory Visit: Payer: BC Managed Care – PPO | Admitting: Family Medicine

## 2021-07-10 ENCOUNTER — Encounter: Payer: Self-pay | Admitting: Family Medicine

## 2021-07-10 ENCOUNTER — Ambulatory Visit (INDEPENDENT_AMBULATORY_CARE_PROVIDER_SITE_OTHER): Payer: BC Managed Care – PPO | Admitting: Family Medicine

## 2021-07-10 ENCOUNTER — Encounter: Payer: Self-pay | Admitting: Internal Medicine

## 2021-07-10 DIAGNOSIS — K2100 Gastro-esophageal reflux disease with esophagitis, without bleeding: Secondary | ICD-10-CM | POA: Diagnosis not present

## 2021-07-10 DIAGNOSIS — R1319 Other dysphagia: Secondary | ICD-10-CM

## 2021-07-10 NOTE — Progress Notes (Signed)
Virtual Visit via telephone note Due to COVID-19 pandemic this visit was conducted virtually. This visit type was conducted due to national recommendations for restrictions regarding the COVID-19 Pandemic (e.g. social distancing, sheltering in place) in an effort to limit this patient's exposure and mitigate transmission in our community. All issues noted in this document were discussed and addressed.  A physical exam was not performed with this format.   I connected with Charles Greene on 07/10/2021 at 0935 by telephone and verified that I am speaking with the correct person using two identifiers. Charles Greene is currently located at home and patient is currently with them during visit. The provider, Charles Baars, FNP is located in their office at time of visit.  I discussed the limitations, risks, security and privacy concerns of performing an evaluation and management service by virtual visit and the availability of in person appointments. I also discussed with the patient that there may be a patient responsible charge related to this service. The patient expressed understanding and agreed to proceed.  Subjective:  Patient ID: Charles Greene, male    DOB: 03-29-1996, 26 y.o.   MRN: [id number]  Chief Complaint:  Gastroesophageal Reflux   HPI: Charles Greene is a 25 y.o. male presenting on 07/10/2021 for Gastroesophageal Reflux   Patient presents today for follow-up after initiation of PPI therapy.  He initially was seen 06/24/2021 to establish care with new PCP.  During that visit he reported epigastric pain, hematemesis, dysphagia, and black tarry stools.  He was to initiate omeprazole, states on today's visit he only picked the medication up 2 days ago.  He was referred to GI and has yet to receive an appointment.  He states he continues to have dysphagia at times.  Denies recent hematemesis.  States some stools remain dark and tarry but some are not.  No fatigue, weakness,  dizziness, palpitations, shortness of breath, pallor, or syncope.   Gastroesophageal Reflux He complains of abdominal pain, dysphagia, globus sensation and heartburn. He reports no belching, no chest pain, no choking, no coughing, no early satiety, no hoarse voice, no nausea, no sore throat, no stridor, no tooth decay, no water brash or no wheezing. This is a recurrent problem. The problem occurs frequently. The problem has been waxing and waning. The symptoms are aggravated by certain foods, lying down and caffeine. Associated symptoms include melena. Pertinent negatives include no anemia, fatigue, muscle weakness, orthopnea or weight loss. He has tried a PPI for the symptoms. The treatment provided no relief (Has only been on for 2 days).    Relevant past medical, surgical, family, and social history reviewed and updated as indicated.  Allergies and medications reviewed and updated.   History reviewed. No pertinent past medical history.  History reviewed. No pertinent surgical history.  Social History   Socioeconomic History   Marital status: Single    Spouse name: Not on file   Number of children: 0   Years of education: 12   Highest education level: High school graduate  Occupational History   Not on file  Tobacco Use   Smoking status: Never   Smokeless tobacco: Current    Types: Chew  Vaping Use   Vaping Use: Some days  Substance and Sexual Activity   Alcohol use: Yes    Alcohol/week: 12.0 standard drinks    Types: 12 Cans of beer per week    Comment: socially   Drug use: No   Sexual activity: Yes  Birth control/protection: None  Other Topics Concern   Not on file  Social History Narrative   Not on file   Social Determinants of Health   Financial Resource Strain: Not on file  Food Insecurity: Not on file  Transportation Needs: Not on file  Physical Activity: Not on file  Stress: Not on file  Social Connections: Not on file  Intimate Partner Violence: Not on  file    Outpatient Encounter Medications as of 07/10/2021  Medication Sig   clotrimazole-betamethasone (LOTRISONE) cream Apply 1 application topically daily.   omeprazole (PRILOSEC) 40 MG capsule Take 1 capsule (40 mg total) by mouth daily.   No facility-administered encounter medications on file as of 07/10/2021.    No Known Allergies  Review of Systems  Constitutional:  Positive for appetite change. Negative for activity change, chills, diaphoresis, fatigue, fever, unexpected weight change and weight loss.  HENT:  Positive for trouble swallowing. Negative for hoarse voice, sore throat and voice change.   Respiratory:  Negative for cough, choking, shortness of breath and wheezing.   Cardiovascular:  Negative for chest pain, palpitations and leg swelling.  Gastrointestinal:  Positive for abdominal pain, blood in stool, dysphagia, heartburn and melena. Negative for abdominal distention, anal bleeding, constipation, diarrhea, nausea, rectal pain and vomiting.  Genitourinary:  Negative for decreased urine volume.  Musculoskeletal:  Negative for muscle weakness.  Skin:  Negative for color change and pallor.  Neurological:  Negative for dizziness, tremors, seizures, syncope, facial asymmetry, speech difficulty, weakness, light-headedness, numbness and headaches.  Psychiatric/Behavioral:  Negative for confusion.   All other systems reviewed and are negative.       Observations/Objective: No vital signs or physical exam, this was a virtual health encounter.  Pt alert and oriented, answers all questions appropriately, and able to speak in full sentences.    Assessment and Plan: Charles Greene was seen today for gastroesophageal reflux.  Diagnoses and all orders for this visit:  Gastroesophageal reflux disease with esophagitis, unspecified whether hemorrhage Esophageal dysphagia Patient was prescribed PPI therapy over 4 weeks ago but just started 2 days ago.  Referral to GI was placed at  initial visit, patient has not received an appointment, new referral placed today.  Patient aware to take PPI as prescribed.  Denies any symptoms of anemia.  Patient aware of red flags which need to be evaluated.  Follow-up in 4 weeks for reevaluation.  Report any new, worsening, or persistent symptoms.  Would likely benefit from H. pylori testing as this has not been repeated recently.  Will need stool testing as he has had H. pylori in the past.  Importance of medication compliance discussed in detail. -     Ambulatory referral to Gastroenterology    Follow Up Instructions: Return in about 4 weeks (around 08/07/2021) for GERD.    I discussed the assessment and treatment plan with the patient. The patient was provided an opportunity to ask questions and all were answered. The patient agreed with the plan and demonstrated an understanding of the instructions.   The patient was advised to call back or seek an in-person evaluation if the symptoms worsen or if the condition fails to improve as anticipated.  The above assessment and management plan was discussed with the patient. The patient verbalized understanding of and has agreed to the management plan. Patient is aware to call the clinic if they develop any new symptoms or if symptoms persist or worsen. Patient is aware when to return to the clinic for a  follow-up visit. Patient educated on when it is appropriate to go to the emergency department.    I provided 15 minutes of time during this telephone encounter.   Charles Baars, FNP-C [facility] [address] [place], [place] [address] [phone] 07/10/2021

## 2021-08-07 ENCOUNTER — Ambulatory Visit: Payer: Medicaid Other | Admitting: Internal Medicine

## 2021-09-17 ENCOUNTER — Encounter: Payer: Self-pay | Admitting: Physician Assistant

## 2021-09-28 NOTE — Progress Notes (Signed)
? ? ? ?09/29/2021 ?Charles Greene ?[id number] ?12/27/1995 ? ? ?ASSESSMENT AND PLAN:  ? ?Gastroesophageal reflux disease, unspecified whether esophagitis present ?-     CBC with Differential/Platelet; Future ?-     Comprehensive metabolic panel; Future ?- will recheck CBC/CMET since has been over 3 months to assure stable H/H ?- Had antibody positive 2016 H pylori, no eradication study ?- discussed stopping ETOH for now ?- continue on prilosec 40 mg daily ?- lifestyle discussed, information given ?- with GERD, melena, hematemesis will schedule EGD with possible dil to evaluate for PUD, H pylori, EOE.  ?Will schedule EGD to evaluate for possible H. pylori, eosinophilic esophagitis, peptic ulcer disease, or strictures. ?I discussed risks of EGD with patient today, including risk of sedation, bleeding or perforation.  ?Patient provides understanding and gave verbal consent to proceed. ? ?Esophageal dysphagia ?-     CBC with Differential/Platelet; Future ?-     Comprehensive metabolic panel; Future ? ?Black tarry stools ?-     CBC with Differential/Platelet; Future ?-     Comprehensive metabolic panel; Future ? ?Hematemesis, unspecified whether nausea present ?-     CBC with Differential/Platelet; Future ?-     Comprehensive metabolic panel; Future ? ? ?Patient Care Team: ?Gabriel Earing, FNP as PCP - General (Family Medicine) ? ?HISTORY OF PRESENT ILLNESS: ?26 y.o. male referred by Gabriel Earing, FNP, with a past medical history of H. pylori antibody positive 2016, treated no real eradication study as stool test was not obtained and others listed below presents for evaluation of GERD, dysphagia, coffee-ground emesis.  ?Saw primary care 06/24/2021 with reports of epigastric pain, hematemesis and dysphagia with black tarry stools, started on omeprazole. ? ?Has been on omeprazole for a week.  ?He has been having nausea, vomiting intermittently for 2-3 months, has happened 3-4 times total, at least once a month.  ?Can  have coffee ground emesis, with BRB at the end, then the following day he has black tarry stools. No pepto/iron. He states stools are normal otherwise.  ?He has GERD x worsening 6 months. Worse when he lays down, normally nocturnal, 1 episode of vomiting at night due to GERD.  ?He states he has trouble with swallowing with bread, feels this gets caught, has been worse the last 3-6 months. States had issues when he was younger as well.  ?Drinks beer every Friday/Saturday, 5-6 beers a night, for several years. ?Has noticed can have nausea/vomiting after drinking.   ?He has history of chewing tobacco and vapes.  ?Denies allergies, asthma, eczema. ?Rare NSAID use 1-2 x a month.   ? ?External labs and notes reviewed this visit: ?CBC  06/24/2021  ?HGB 16.3 MCV 91 without evidence of anemia ?WBC 7.4 Platelets 200 ?Kidney function 06/24/2021  ?BUN 16 Cr 1.03  ?Potassium 4.4   ?LFTs 06/24/2021  ?AST 27 ALT 64 ?Alkphos 72 TBili 0.5 ? ? ?Current Medications:  ? ? ? ? ? ? ?Current Outpatient Medications (Other):  ?  clotrimazole-betamethasone (LOTRISONE) cream, Apply 1 application topically daily. ?  omeprazole (PRILOSEC) 40 MG capsule, Take 1 capsule (40 mg total) by mouth daily. ? ?Medical History:  ?Past Medical History:  ?Diagnosis Date  ? GERD (gastroesophageal reflux disease)   ? ?Allergies: No Known Allergies  ? ?Surgical History:  ?He  has a past surgical history that includes Adenoidectomy. ?Family History:  ?His family history includes Arthritis in his father, maternal grandfather, and maternal grandmother; Asthma in his brother; Cancer in his maternal  grandfather and paternal grandfather; Diabetes in his maternal grandfather and mother; Kidney disease in his maternal grandfather; Stroke in his paternal grandmother. ?Social History:  ? reports that he has never smoked. His smokeless tobacco use includes chew. He reports current alcohol use of about 12.0 standard drinks per week. He reports that he does not use  drugs. ? ?REVIEW OF SYSTEMS  : All other systems reviewed and negative except where noted in the History of Present Illness. ? ? ?PHYSICAL EXAM: ?BP 124/84   Pulse 85   Ht 5\' 10"  (1.778 m)   Wt 231 lb (104.8 kg)   SpO2 98%   BMI 33.15 kg/m?  ?General:   Pleasant, well developed male in no acute distress ?Head:  Normocephalic and atraumatic. ?Eyes: sclerae anicteric,conjunctive pink  ?Heart:  regular rate and rhythm ?Pulm: Clear anteriorly; no wheezing ?Abdomen:  Soft, Obese AB, skin exam normal, Normal bowel sounds. mild tenderness in the epigastrium. Without guarding and Without rebound, without organomegaly. ?Extremities:  Without edema. ?Msk:  Symmetrical without gross deformities. Peripheral pulses intact.  ?Neurologic:  Alert and  oriented x4;  grossly normal neurologically. ?Skin:   Dry and intact without significant lesions or rashes. ?Psychiatric: Demonstrates good judgement and reason without abnormal affect or behaviors. ? ? ? , PA-C ?9:44 AM ? ? ?

## 2021-09-29 ENCOUNTER — Encounter: Payer: Self-pay | Admitting: Physician Assistant

## 2021-09-29 ENCOUNTER — Other Ambulatory Visit (INDEPENDENT_AMBULATORY_CARE_PROVIDER_SITE_OTHER): Payer: BC Managed Care – PPO

## 2021-09-29 ENCOUNTER — Ambulatory Visit: Payer: BC Managed Care – PPO | Admitting: Physician Assistant

## 2021-09-29 VITALS — BP 124/84 | HR 85 | Ht 70.0 in | Wt 231.0 lb

## 2021-09-29 DIAGNOSIS — K921 Melena: Secondary | ICD-10-CM | POA: Diagnosis not present

## 2021-09-29 DIAGNOSIS — K920 Hematemesis: Secondary | ICD-10-CM

## 2021-09-29 DIAGNOSIS — K219 Gastro-esophageal reflux disease without esophagitis: Secondary | ICD-10-CM

## 2021-09-29 DIAGNOSIS — R1319 Other dysphagia: Secondary | ICD-10-CM

## 2021-09-29 LAB — CBC WITH DIFFERENTIAL/PLATELET
Basophils Absolute: 0.1 10*3/uL (ref 0.0–0.1)
Basophils Relative: 1.3 % (ref 0.0–3.0)
Eosinophils Absolute: 0.5 10*3/uL (ref 0.0–0.7)
Eosinophils Relative: 6.7 % — ABNORMAL HIGH (ref 0.0–5.0)
HCT: 44 % (ref 39.0–52.0)
Hemoglobin: 15.7 g/dL (ref 13.0–17.0)
Lymphocytes Relative: 27.9 % (ref 12.0–46.0)
Lymphs Abs: 2.1 10*3/uL (ref 0.7–4.0)
MCHC: 35.6 g/dL (ref 30.0–36.0)
MCV: 91.1 fl (ref 78.0–100.0)
Monocytes Absolute: 0.5 10*3/uL (ref 0.1–1.0)
Monocytes Relative: 6.6 % (ref 3.0–12.0)
Neutro Abs: 4.4 10*3/uL (ref 1.4–7.7)
Neutrophils Relative %: 57.5 % (ref 43.0–77.0)
Platelets: 192 10*3/uL (ref 150.0–400.0)
RBC: 4.84 Mil/uL (ref 4.22–5.81)
RDW: 12.7 % (ref 11.5–15.5)
WBC: 7.7 10*3/uL (ref 4.0–10.5)

## 2021-09-29 LAB — COMPREHENSIVE METABOLIC PANEL
ALT: 33 U/L (ref 0–53)
AST: 21 U/L (ref 0–37)
Albumin: 4.6 g/dL (ref 3.5–5.2)
Alkaline Phosphatase: 62 U/L (ref 39–117)
BUN: 13 mg/dL (ref 6–23)
CO2: 26 mEq/L (ref 19–32)
Calcium: 9.5 mg/dL (ref 8.4–10.5)
Chloride: 103 mEq/L (ref 96–112)
Creatinine, Ser: 0.98 mg/dL (ref 0.40–1.50)
GFR: 106.45 mL/min (ref >60.00)
Glucose, Bld: 94 mg/dL (ref 70–99)
Potassium: 3.8 mEq/L (ref 3.5–5.1)
Sodium: 138 mEq/L (ref 135–145)
Total Bilirubin: 0.7 mg/dL (ref 0.2–1.2)
Total Protein: 7.4 g/dL (ref 6.0–8.3)

## 2021-09-29 NOTE — Patient Instructions (Addendum)
If you are age 27 or older, your body mass index should be between 23-30. Your Body mass index is 33.15 kg/m. If this is out of the aforementioned range listed, please consider follow up with your Primary Care Provider.  If you are age 1 or younger, your body mass index should be between 19-25. Your Body mass index is 33.15 kg/m. If this is out of the aformentioned range listed, please consider follow up with your Primary Care Provider.   ________________________________________________________  The [facility] providers would like to encourage you to use [facility] to communicate with providers for non-urgent requests or questions.  Due to long hold times on the telephone, sending your provider a message by [facility] may be a faster and more efficient way to get a response.  Please allow 48 business hours for a response.  Please remember that this is for non-urgent requests.  _______________________________________________________  Charles Greene have been scheduled for an endoscopy. Please follow written instructions given to you at your visit today. If you use inhalers (even only as needed), please bring them with you on the day of your procedure.  Your provider has requested that you go to the basement level for lab work before leaving today. Press "B" on the elevator. The lab is located at the first door on the left as you exit the elevator.  Due to recent changes in healthcare laws, you may see the results of your imaging and laboratory studies on MyChart before your provider has had a chance to review them.  We understand that in some cases there may be results that are confusing or concerning to you. Not all laboratory results come back in the same time frame and the provider may be waiting for multiple results in order to interpret others.  Please give [place] 48 hours in order for your provider to thoroughly review all the results before contacting the office for clarification of your results.    It was a  pleasure to see you today!  Thank you for trusting me with your gastrointestinal care!    Please take your proton pump inhibitor medication 30 minutes to 1 hour before meals- this makes it more effective.  Avoid spicy and acidic foods Avoid fatty foods Limit your intake of coffee, tea, alcohol, and carbonated drinks Work to maintain a healthy weight Keep the head of the bed elevated at least 3 inches with blocks or a wedge pillow if you are having any nighttime symptoms Stay upright for 2 hours after eating Avoid meals and snacks three to four hours before bedtime Stop smoking/vaping  Gastroesophageal Reflux Disease, Adult Gastroesophageal reflux (GER) happens when acid from the stomach flows up into the tube that connects the mouth and the stomach (esophagus). Normally, food travels down the esophagus and stays in the stomach to be digested. However, when a person has GER, food and stomach acid sometimes move back up into the esophagus. If this becomes a more serious problem, the person may be diagnosed with a disease called gastroesophageal reflux disease (GERD). GERD occurs when the reflux: Happens often. Causes frequent or severe symptoms. Causes problems such as damage to the esophagus. When stomach acid comes in contact with the esophagus, the acid may cause inflammation in the esophagus. Over time, GERD may create small holes (ulcers) in the lining of the esophagus. What are the causes? This condition is caused by a problem with the muscle between the esophagus and the stomach (lower esophageal sphincter, or LES). Normally, the LES  muscle closes after food passes through the esophagus to the stomach. When the LES is weakened or abnormal, it does not close properly, and that allows food and stomach acid to go back up into the esophagus. The LES can be weakened by certain dietary substances, medicines, and medical conditions, including: Tobacco use. Pregnancy. Having a hiatal  hernia. Alcohol use. Certain foods and beverages, such as coffee, chocolate, onions, and peppermint. What increases the risk? You are more likely to develop this condition if you: Have an increased body weight. Have a connective tissue disorder. Take NSAIDs, such as ibuprofen. What are the signs or symptoms? Symptoms of this condition include: Heartburn. Difficult or painful swallowing and the feeling of having a lump in the throat. A bitter taste in the mouth. Bad breath and having a large amount of saliva. Having an upset or bloated stomach and belching. Chest pain. Different conditions can cause chest pain. Make sure you see your health care provider if you experience chest pain. Shortness of breath or wheezing. Ongoing (chronic) cough or a nighttime cough. Wearing away of tooth enamel. Weight loss. How is this diagnosed? This condition may be diagnosed based on a medical history and a physical exam. To determine if you have mild or severe GERD, your health care provider may also monitor how you respond to treatment. You may also have tests, including: A test to examine your stomach and esophagus with a small camera (endoscopy). A test that measures the acidity level in your esophagus. A test that measures how much pressure is on your esophagus. A barium swallow or modified barium swallow test to show the shape, size, and functioning of your esophagus. How is this treated? Treatment for this condition may vary depending on how severe your symptoms are. Your health care provider may recommend: Changes to your diet. Medicine. Surgery. The goal of treatment is to help relieve your symptoms and to prevent complications. Follow these instructions at home: Eating and drinking  Follow a diet as recommended by your health care provider. This may involve avoiding foods and drinks such as: Coffee and tea, with or without caffeine. Drinks that contain alcohol. Energy drinks and sports  drinks. Carbonated drinks or sodas. Chocolate and cocoa. Peppermint and mint flavorings. Garlic and onions. Horseradish. Spicy and acidic foods, including peppers, chili powder, curry powder, vinegar, hot sauces, and barbecue sauce. Citrus fruit juices and citrus fruits, such as oranges, lemons, and limes. Tomato-based foods, such as red sauce, chili, salsa, and pizza with red sauce. Fried and fatty foods, such as donuts, french fries, potato chips, and high-fat dressings. High-fat meats, such as hot dogs and fatty cuts of red and white meats, such as rib eye steak, sausage, ham, and bacon. High-fat dairy items, such as whole milk, butter, and cream cheese. Eat small, frequent meals instead of large meals. Avoid drinking large amounts of liquid with your meals. Avoid eating meals during the 2-3 hours before bedtime. Avoid lying down right after you eat. Do not exercise right after you eat. Lifestyle  Do not use any products that contain nicotine or tobacco. These products include cigarettes, chewing tobacco, and vaping devices, such as e-cigarettes. If you need help quitting, ask your health care provider. Try to reduce your stress by using methods such as yoga or meditation. If you need help reducing stress, ask your health care provider. If you are overweight, reduce your weight to an amount that is healthy for you. Ask your health care provider for guidance about  a safe weight loss goal. General instructions Pay attention to any changes in your symptoms. Take over-the-counter and prescription medicines only as told by your health care provider. Do not take aspirin, ibuprofen, or other NSAIDs unless your health care provider told you to take these medicines. Wear loose-fitting clothing. Do not wear anything tight around your waist that causes pressure on your abdomen. Raise (elevate) the head of your bed about 6 inches (15 cm). You can use a wedge to do this. Avoid bending over if this  makes your symptoms worse. Keep all follow-up visits. This is important. Contact a health care provider if: You have: New symptoms. Unexplained weight loss. Difficulty swallowing or it hurts to swallow. Wheezing or a persistent cough. A hoarse voice. Your symptoms do not improve with treatment. Get help right away if: You have sudden pain in your arms, neck, jaw, teeth, or back. You suddenly feel sweaty, dizzy, or light-headed. You have chest pain or shortness of breath. You vomit and the vomit is green, yellow, or black, or it looks like blood or coffee grounds. You faint. You have stool that is red, bloody, or black. You cannot swallow, drink, or eat. These symptoms may represent a serious problem that is an emergency. Do not wait to see if the symptoms will go away. Get medical help right away. Call your local emergency services (911 in the [place] Do not drive yourself to the hospital. Summary Gastroesophageal reflux happens when acid from the stomach flows up into the esophagus. GERD is a disease in which the reflux happens often, causes frequent or severe symptoms, or causes problems such as damage to the esophagus. Treatment for this condition may vary depending on how severe your symptoms are. Your health care provider may recommend diet and lifestyle changes, medicine, or surgery. Contact a health care provider if you have new or worsening symptoms. Take over-the-counter and prescription medicines only as told by your health care provider. Do not take aspirin, ibuprofen, or other NSAIDs unless your health care provider told you to do so. Keep all follow-up visits as told by your health care provider. This is important. This information is not intended to replace advice given to you by your health care provider. Make sure you discuss any questions you have with your health care provider. Document Revised: 01/21/2020 Document Reviewed: 01/21/2020 Elsevier Patient Education  [place].

## 2021-09-29 NOTE — Progress Notes (Signed)
Reviewed and agree with management plans. ? ?Charles Bump L. Charles Slemmer, MD, MPH  ?

## 2021-10-02 ENCOUNTER — Telehealth: Payer: Self-pay

## 2021-10-02 ENCOUNTER — Ambulatory Visit (AMBULATORY_SURGERY_CENTER): Payer: BC Managed Care – PPO | Admitting: Gastroenterology

## 2021-10-02 ENCOUNTER — Encounter: Payer: Self-pay | Admitting: Gastroenterology

## 2021-10-02 VITALS — BP 120/72 | HR 61 | Temp 97.2°F | Resp 12 | Ht 70.0 in | Wt 231.0 lb

## 2021-10-02 DIAGNOSIS — R1310 Dysphagia, unspecified: Secondary | ICD-10-CM

## 2021-10-02 DIAGNOSIS — K219 Gastro-esophageal reflux disease without esophagitis: Secondary | ICD-10-CM

## 2021-10-02 DIAGNOSIS — K2100 Gastro-esophageal reflux disease with esophagitis, without bleeding: Secondary | ICD-10-CM

## 2021-10-02 DIAGNOSIS — K2210 Ulcer of esophagus without bleeding: Secondary | ICD-10-CM | POA: Diagnosis not present

## 2021-10-02 DIAGNOSIS — K920 Hematemesis: Secondary | ICD-10-CM

## 2021-10-02 MED ORDER — SODIUM CHLORIDE 0.9 % IV SOLN: 500.0000 mL | INTRAVENOUS | Status: DC

## 2021-10-02 MED ORDER — OMEPRAZOLE 40 MG PO CPDR: 40.0000 mg | DELAYED_RELEASE_CAPSULE | Freq: Two times a day (BID) | ORAL | 2 refills | Status: DC

## 2021-10-02 NOTE — Progress Notes (Signed)
Pt's states no medical or surgical changes since previsit or office visit. 

## 2021-10-02 NOTE — Telephone Encounter (Signed)
Per Dr. Orvan Falconer request, pt has been scheduled for 4-6 wk f/u on 4/17 @ 11am. Appt reminder has been mailed. Appt will reflect on AVS upon [facility] discharge for pt future reference. ? ?

## 2021-10-02 NOTE — Progress Notes (Signed)
A and O x3. Report to RN. Tolerated MAC anesthesia well. 

## 2021-10-02 NOTE — Patient Instructions (Signed)
Resume previous diet.  Continue present medications.  Increase Omeprazole to 40mg  twice a day for at least 10 weeks.  Await pathology results. ? ?Reflux lifestyle modifications. ? ? ?YOU HAD AN ENDOSCOPIC PROCEDURE TODAY AT THE [facility]:   Refer to the procedure report that was given to you for any specific questions about what was found during the examination.  If the procedure report does not answer your questions, please call your gastroenterologist to clarify.  If you requested that your care partner not be given the details of your procedure findings, then the procedure report has been included in a sealed envelope for you to review at your convenience later. ? ?YOU SHOULD EXPECT: Some feelings of bloating in the abdomen. Passage of more gas than usual.  Walking can help get rid of the air that was put into your GI tract during the procedure and reduce the bloating. If you had a lower endoscopy (such as a colonoscopy or flexible sigmoidoscopy) you may notice spotting of blood in your stool or on the toilet paper. If you underwent a bowel prep for your procedure, you may not have a normal bowel movement for a few days. ? ?Please Note:  You might notice some irritation and congestion in your nose or some drainage.  This is from the oxygen used during your procedure.  There is no need for concern and it should clear up in a day or so. ? ?SYMPTOMS TO REPORT IMMEDIATELY: ? ? ?Following upper endoscopy (EGD) ? Vomiting of blood or coffee ground material ? New chest pain or pain under the shoulder blades ? Painful or persistently difficult swallowing ? New shortness of breath ? Fever of 100?F or higher ? Black, tarry-looking stools ? ?For urgent or emergent issues, a gastroenterologist can be reached at any hour by calling [phone] . ?Do not use MyChart messaging for urgent concerns.  ? ? ?DIET:  We do recommend a small meal at first, but then you may proceed to your regular diet.  Drink plenty of  fluids but you should avoid alcoholic beverages for 24 hours. ? ?ACTIVITY:  You should plan to take it easy for the rest of today and you should NOT DRIVE or use heavy machinery until tomorrow (because of the sedation medicines used during the test).   ? ?FOLLOW UP: ?Our staff will call the number listed on your records 48-72 hours following your procedure to check on you and address any questions or concerns that you may have regarding the information given to you following your procedure. If we do not reach you, we will leave a message.  We will attempt to reach you two times.  During this call, we will ask if you have developed any symptoms of COVID 19. If you develop any symptoms (ie: fever, flu-like symptoms, shortness of breath, cough etc.) before then, please call [phone].  If you test positive for Covid 19 in the 2 weeks post procedure, please call and report this information to [phone].   ? ?If any biopsies were taken you will be contacted by phone or by letter within the next 1-3 weeks.  Please call us at [phone] if you have not heard about the biopsies in 3 weeks.  ? ? ?SIGNATURES/CONFIDENTIALITY: ?You and/or your care partner have signed paperwork which will be entered into your electronic medical record.  These signatures attest to the fact that that the information above on your After Visit Summary has been reviewed and is understood.  Full responsibility of the confidentiality of this discharge information lies with you and/or your care-partner.  ?

## 2021-10-02 NOTE — Telephone Encounter (Signed)
-----   Message from Thornton Park, MD sent at 10/02/2021 11:33 AM EST ----- ?Follow-up with Estill Bamberg in 4-6 weeks. Thanks. ? ?KLB ? ?

## 2021-10-02 NOTE — Progress Notes (Signed)
Indications for procedure: Dysphagia, reflux, melena, hematemesis ? ?See office note from 09/29/21 for complete details. No change in history or physical exam. He remains an appropriate candidate for monitored anesthesia care in the [facility].  ?

## 2021-10-02 NOTE — Op Note (Signed)
[facility] ?Patient Name: Charles Greene ?Procedure Date: 10/02/2021 11:17 AM ?MRN: [id number] ?Endoscopist: Tressia Danas MD, MD ?Age: 26 ?Referring MD:  ?Date of Birth: 1996-07-05 ?Gender: Male ?Account #: [id number] ?Procedure:                Upper GI endoscopy ?Indications:              Dysphagia, Hematemesis ?Medicines:                Monitored Anesthesia Care ?Procedure:                Pre-Anesthesia Assessment: ?                          - Prior to the procedure, a History and Physical  ?                          was performed, and patient medications and  ?                          allergies were reviewed. The patient's tolerance of  ?                          previous anesthesia was also reviewed. The risks  ?                          and benefits of the procedure and the sedation  ?                          options and risks were discussed with the patient.  ?                          All questions were answered, and informed consent  ?                          was obtained. Prior Anticoagulants: The patient has  ?                          taken no previous anticoagulant or antiplatelet  ?                          agents. ASA Grade Assessment: II - A patient with  ?                          mild systemic disease. After reviewing the risks  ?                          and benefits, the patient was deemed in  ?                          satisfactory condition to undergo the procedure. ?                          After obtaining informed consent, the endoscope was  ?  passed under direct vision. Throughout the  ?                          procedure, the patient's blood pressure, pulse, and  ?                          oxygen saturations were monitored continuously. The  ?                          Endoscope was introduced through the mouth, and  ?                          advanced to the third part of duodenum. The upper  ?                          GI endoscopy was accomplished  without difficulty.  ?                          The patient tolerated the procedure well. ?Scope In: ?Scope Out: ?Findings:                 LA Grade C (one or more mucosal breaks continuous  ?                          between tops of 2 or more mucosal folds, less than  ?                          75% circumference) esophagitis with no bleeding was  ?                          found 36 cm from the incisors. Biopsies were taken  ?                          from the mid/proximal and distal esophagus with a  ?                          cold forceps for histology. Estimated blood loss  ?                          was minimal. ?                          The stomach was normal. ?                          The examined duodenum was normal. ?Complications:            No immediate complications. Estimated blood loss:  ?                          Minimal. ?Estimated Blood Loss:     Estimated blood loss was minimal. ?Impression:               - LA Grade C reflux esophagitis with no bleeding.  ?  Biopsied. ?                          - Normal stomach. ?                          - Normal examined duodenum. ?Recommendation:           - Patient has a contact number available for  ?                          emergencies. The signs and symptoms of potential  ?                          delayed complications were discussed with the  ?                          patient. Return to normal activities tomorrow.  ?                          Written discharge instructions were provided to the  ?                          patient. ?                          - Resume previous diet. ?                          - Continue present medications. Increase omeprazole  ?                          to 40 mg BID for at least 10 weeks. ?                          - Await pathology results. ?                          - Reflux lifestyle modifications. ?Tressia DanasKimberly Remmi Armenteros MD, MD ?10/02/2021 11:37:10 AM ?This report has been signed electronically. ?

## 2021-10-06 ENCOUNTER — Telehealth: Payer: Self-pay | Admitting: *Deleted

## 2021-10-06 ENCOUNTER — Telehealth: Payer: Self-pay

## 2021-10-06 NOTE — Telephone Encounter (Signed)
?  Follow up Call- ? ?Call back number 10/02/2021  ?Post procedure Call Back phone  # [phone]  ?Permission to leave phone message No  ?Some recent data might be hidden  ?  ? ?No answer and no voicemail ?

## 2021-10-06 NOTE — Telephone Encounter (Signed)
?  Follow up Call- ? ?Call back number 10/02/2021  ?Post procedure Call Back phone  # [phone]  ?Permission to leave phone message No  ?Some recent data might be hidden  ?  ? ?Patient questions: ? ?Do you have a fever, pain , or abdominal swelling? No. ?Pain Score  0 * ? ?Have you tolerated food without any problems? Yes.   ? ?Have you been able to return to your normal activities? Yes.   ? ?Do you have any questions about your discharge instructions: ?Diet   No. ?Medications  No. ?Follow up visit  No. ? ?Do you have questions or concerns about your Care? No. ? ?Actions: ?* If pain score is 4 or above: ?No action needed, pain <4. ? ?Have you developed a fever since your procedure? no ? ?2.   Have you had an respiratory symptoms (SOB or cough) since your procedure? no ? ?3.   Have you tested positive for COVID 19 since your procedure no ? ?4.   Have you had any family members/close contacts diagnosed with the COVID 19 since your procedure?  no ? ? ?If yes to any of these questions please route to Laverna Peace, RN and Karlton Lemon, RN ? ? ? ?

## 2021-10-14 ENCOUNTER — Encounter: Payer: Self-pay | Admitting: Gastroenterology

## 2021-10-14 ENCOUNTER — Ambulatory Visit: Payer: BC Managed Care – PPO | Admitting: Family Medicine

## 2021-11-06 NOTE — Progress Notes (Deleted)
? ? ?  11/06/2021 ?Charles Greene ?[id number] ?07/15/1996 ? ?Referring provider: Gabriel Earing, FNP ?Primary GI doctor: Dr. Orvan Falconer ? ?ASSESSMENT AND PLAN:  ? ?There are no diagnoses linked to this encounter. ? ? ?History of Present Illness:  ?26 y.o. male  with a past medical history of GERD and others listed below, returns to clinic today for evaluation of reflux esophagitis. ?Patient was last seen 09/29/2021 for reflux and melena.  At that time patient had no evidence of anemia. ?10/02/2021 endoscopy with Dr. Orvan Falconer showed LA grade C esophagitis, normal stomach and duodenum. ?Protonix 40 mg twice daily for 10 weeks. ? ? ?10/02/2021 EGD LA Grade C esophagitis ?1. Surgical [P], distal esophagus ?SEVERE ACUTE NONSPECIFIC ESOPHAGITIS WITH ULCERATION. SEE COMMENT. ?IMMUNOHISTOCHEMISTRY WITH APPROPRIATE CONTROLS FOR PANCYTOKERATIN DOES NOT REVEAL ANY ?MALIGNANCY. ?SPECIAL STAINS FOR FUNGUS, PAS AND GMS ARE NEGATIVE FOR FUNGAL ORGANISMS. ?2. Surgical [P], mid esophagus and proximal ?FRAGMENTS OF SQUAMOUS MUCOSA WITH NO SIGNIFICANT INFLAMMATION OR EOSINOPHILS. ?NO MALIGNANCY IS SEEN. ?COMMENT: ?THE DIFFERENTIAL DIAGNOSIS OF SEVERE ACUTE NONSPECIFIC ESOPHAGITIS WITH ULCERATION SEEN IN ?SPECIMEN 1 INCLUDES INFECTIOUS ETIOLOGY, DRUGS AND SEVERE GASTROESOPHAGEAL REFLUX DISEASE. ?CLINICAL AND ENDOSCOPIC CORRELATION IS REQUIRED. ? ?Current Medications:  ? ? ? ? ? ? ?Current Outpatient Medications (Other):  ?  clotrimazole-betamethasone (LOTRISONE) cream, Apply 1 application topically daily. ?  omeprazole (PRILOSEC) 40 MG capsule, Take 1 capsule (40 mg total) by mouth in the morning and at bedtime. ? ?Surgical History:  ?He  has a past surgical history that includes Adenoidectomy. ?Family History:  ?His family history includes Arthritis in his father, maternal grandfather, and maternal grandmother; Asthma in his brother; Cancer in his maternal grandfather and paternal grandfather; Diabetes in his maternal grandfather and  mother; Kidney disease in his maternal grandfather; Stroke in his paternal grandmother. ?Social History:  ? reports that he has never smoked. His smokeless tobacco use includes chew. He reports current alcohol use of about 12.0 standard drinks per week. He reports that he does not use drugs. ? ?Current Medications, Allergies, Past Medical History, Past Surgical History, Family History and Social History were reviewed in [employer] record. ? ?Physical Exam: ?There were no vitals taken for this visit. ?General:   Pleasant, well developed male in no acute distress ?Heart:  Regular rate and rhythm; no murmurs ?Pulm: Clear anteriorly; no wheezing ?Abdomen:  {BlankSingle:19197::"Distended","Ridged","Soft"}, {BlankSingle:19197::"Flat","Obese","Non-distended"} AB, {BlankSingle:19197::"Absent","Hyperactive, tinkling","Hypoactive","Sluggish","Active"} bowel sounds. {actendernessAB:27319} tenderness {anatomy; site abdomen:5010}. {BlankMultiple:19196::"Without guarding","With guarding","Without rebound","With rebound"}, No organomegaly appreciated. ?Extremities:  {With/without:5700}  edema. ?Neurologic:  Alert and  oriented x4;  No focal deficits.  ?Psych:  Cooperative. Normal mood and affect. ? ? ?Doree Albee, PA-C ?11/06/21 ?

## 2021-11-09 ENCOUNTER — Ambulatory Visit: Payer: BC Managed Care – PPO | Admitting: Physician Assistant

## 2022-01-07 IMAGING — CR DG SHOULDER 2+V*L*
4 series · 4 of 4 positions shown · non-contrast
Comparison: None.

CLINICAL DATA: Left shoulder pain after lifting heavy object 1 week
ago. Initial encounter.

EXAM:
LEFT SHOULDER - 2+ VIEW

[w shoulder ap external left *]
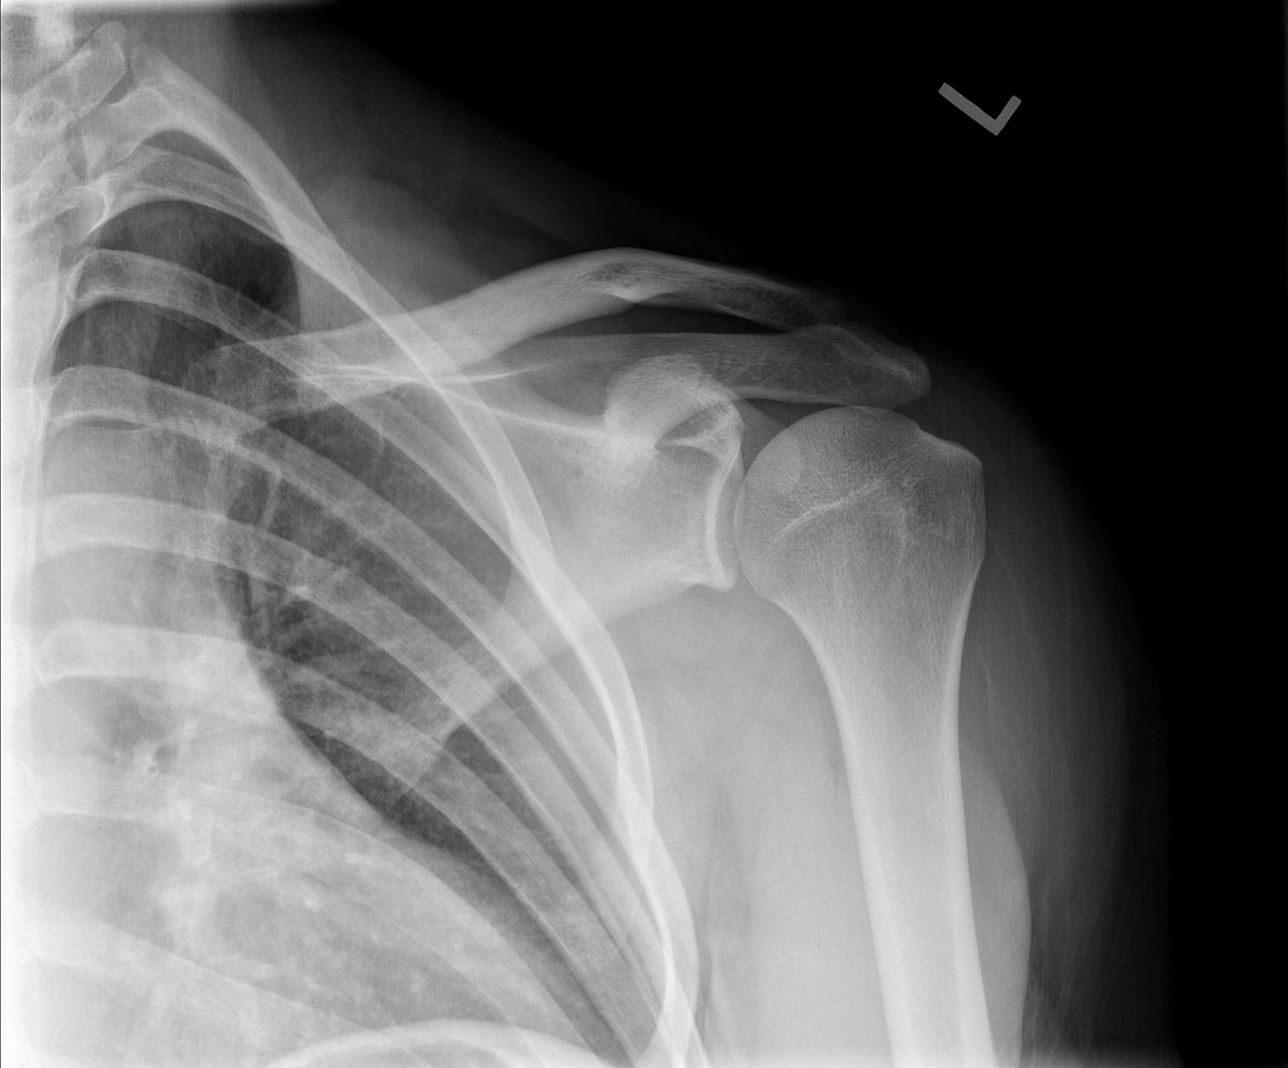

[w shoulder y view left * (1 of 2)]
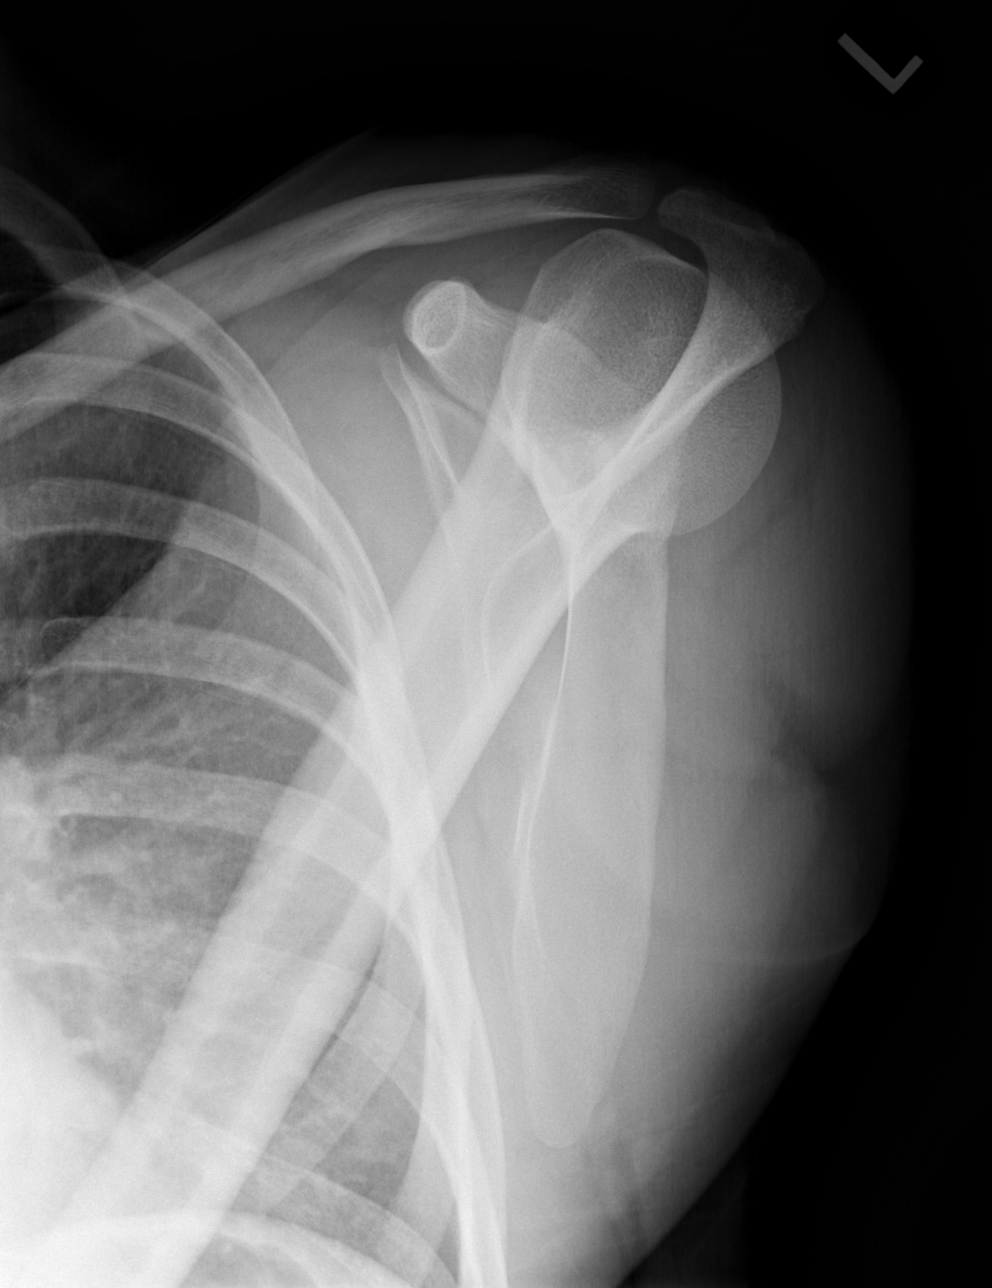

[w shoulder y view left * (2 of 2)]
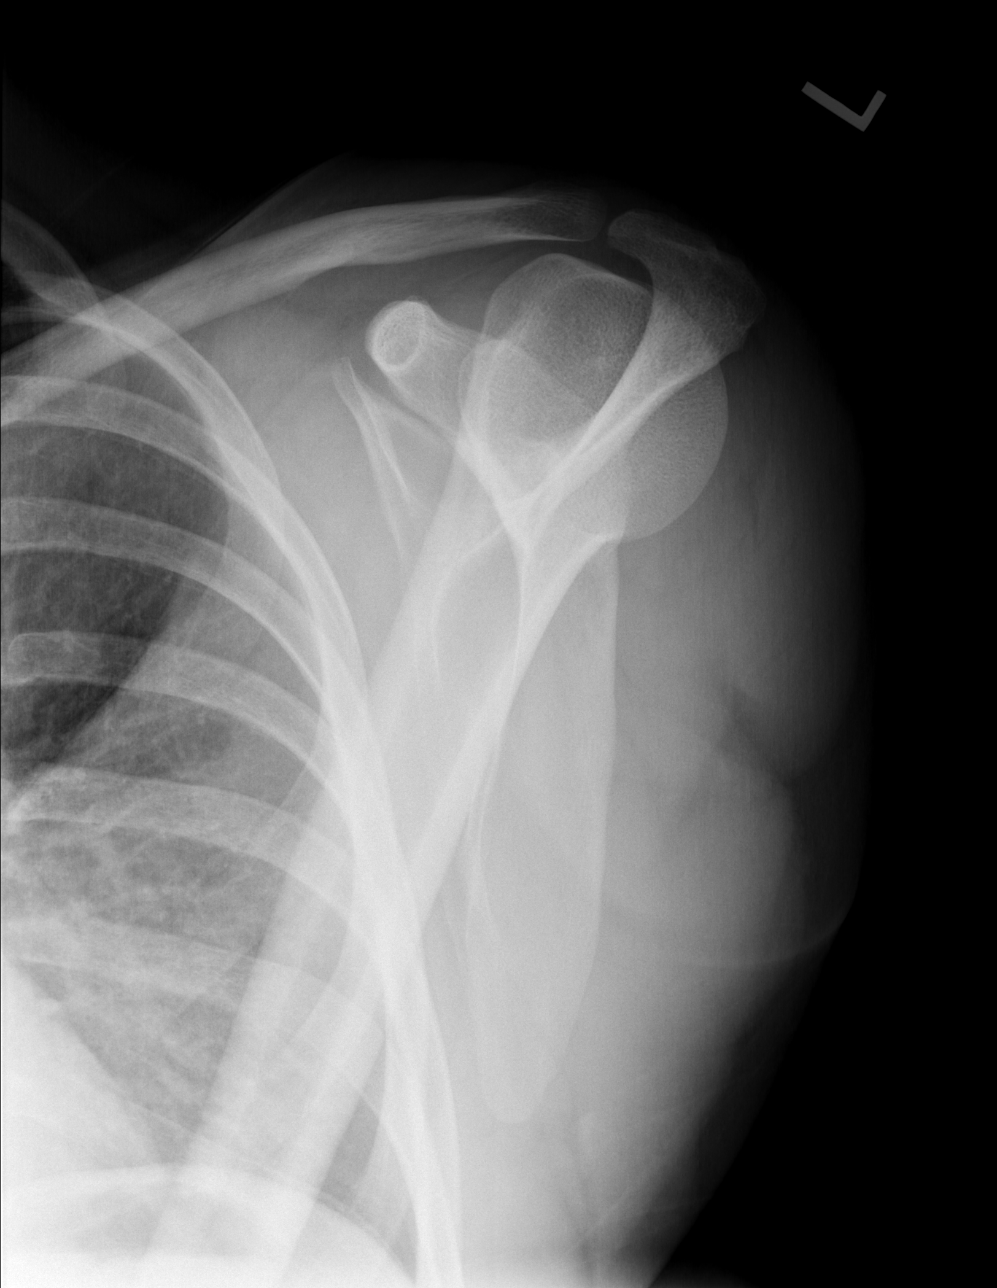

[w shoulder axillary left *]
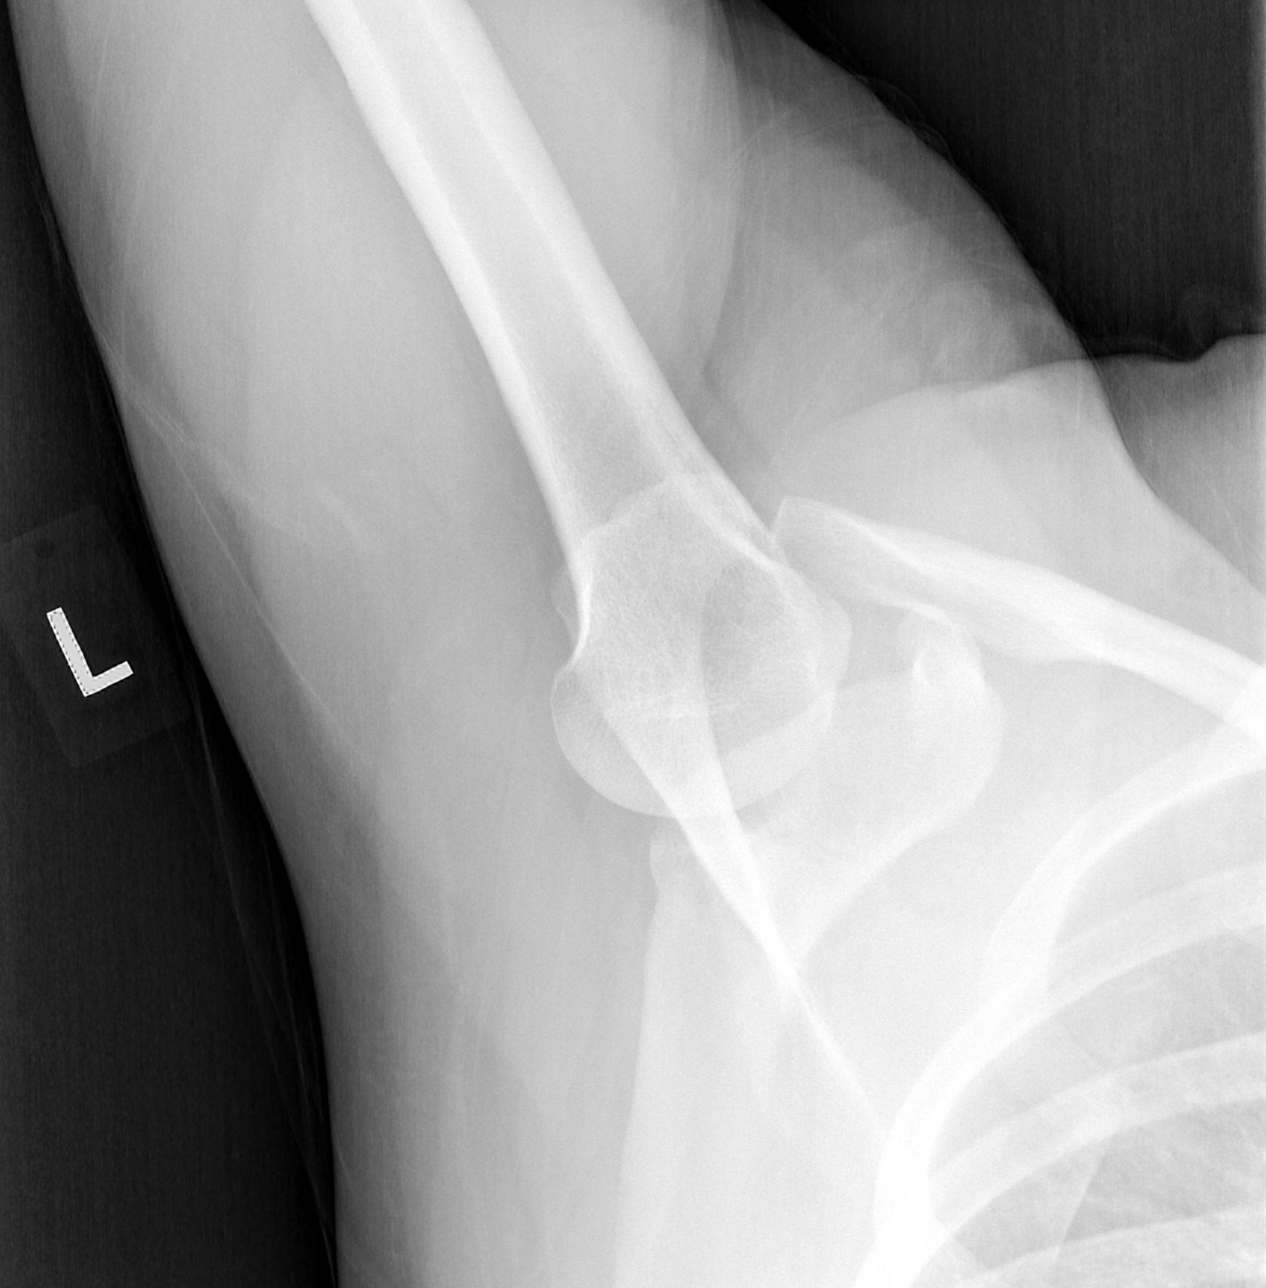

[4 of 4 positions shown; findings below may reference images not displayed]

FINDINGS: There is no evidence of fracture or dislocation. There is no
evidence of arthropathy or other focal bone abnormality. Soft
tissues are unremarkable.
IMPRESSION: Negative.

## 2022-01-27 DIAGNOSIS — Z6833 Body mass index (BMI) 33.0-33.9, adult: Secondary | ICD-10-CM | POA: Diagnosis not present

## 2022-01-27 DIAGNOSIS — H6502 Acute serous otitis media, left ear: Secondary | ICD-10-CM | POA: Diagnosis not present

## 2022-01-27 DIAGNOSIS — H6120 Impacted cerumen, unspecified ear: Secondary | ICD-10-CM | POA: Diagnosis not present

## 2022-01-27 DIAGNOSIS — H66001 Acute suppurative otitis media without spontaneous rupture of ear drum, right ear: Secondary | ICD-10-CM | POA: Diagnosis not present

## 2022-05-25 ENCOUNTER — Ambulatory Visit: Payer: BC Managed Care – PPO | Admitting: Family Medicine

## 2022-05-25 ENCOUNTER — Encounter: Payer: Self-pay | Admitting: Family Medicine

## 2022-05-25 VITALS — BP 138/77 | HR 97 | Temp 98.7°F | Ht 70.0 in | Wt 234.0 lb

## 2022-05-25 DIAGNOSIS — L0291 Cutaneous abscess, unspecified: Secondary | ICD-10-CM | POA: Diagnosis not present

## 2022-05-25 DIAGNOSIS — Z20818 Contact with and (suspected) exposure to other bacterial communicable diseases: Secondary | ICD-10-CM

## 2022-05-25 MED ORDER — SULFAMETHOXAZOLE-TRIMETHOPRIM 800-160 MG PO TABS: 1.0000 | ORAL_TABLET | Freq: Two times a day (BID) | ORAL | 0 refills | Status: AC

## 2022-05-25 NOTE — Progress Notes (Signed)
Subjective:  Patient ID: Charles Greene, male    DOB: 01/30/1996, 26 y.o.   MRN: [id number]  Patient Care Team: Gabriel Earing, FNP as PCP - General (Family Medicine)   Chief Complaint:  skin check (Came up about 1 week ago started out as a size of a pimple )   HPI: Charles Greene is a 26 y.o. male presenting on 05/25/2022 for skin check (Came up about 1 week ago started out as a size of a pimple )   Pt presents today for evaluation of an abscess to his neck. States this started 1 week ago and continues to get worse. Father was recently treated for MRSA infection of skin. Has been in close contact with him. Pt states he has been picking at the lesion, only blood return per pt. Denies fever, chills, malaise, or fatigue.      Relevant past medical, surgical, family, and social history reviewed and updated as indicated.  Allergies and medications reviewed and updated. Data reviewed: Chart in Epic.   Past Medical History:  Diagnosis Date   GERD (gastroesophageal reflux disease)     History reviewed. No pertinent surgical history.  Social History   Socioeconomic History   Marital status: Single    Spouse name: Not on file   Number of children: 0   Years of education: 12   Highest education level: High school graduate  Occupational History   Not on file  Tobacco Use   Smoking status: Never   Smokeless tobacco: Current    Types: Chew  Vaping Use   Vaping Use: Some days  Substance and Sexual Activity   Alcohol use: Yes    Alcohol/week: 12.0 standard drinks of alcohol    Types: 12 Cans of beer per week    Comment: socially   Drug use: No   Sexual activity: Yes    Birth control/protection: None  Other Topics Concern   Not on file  Social History Narrative   Not on file   Social Determinants of Health   Financial Resource Strain: Not on file  Food Insecurity: Not on file  Transportation Needs: Not on file  Physical Activity: Not on file  Stress: Not on  file  Social Connections: Not on file  Intimate Partner Violence: Not on file    Outpatient Encounter Medications as of 05/25/2022  Medication Sig   sulfamethoxazole-trimethoprim (BACTRIM DS) 800-160 MG tablet Take 1 tablet by mouth 2 (two) times daily for 10 days.   [DISCONTINUED] clotrimazole-betamethasone (LOTRISONE) cream Apply 1 application topically daily.   [DISCONTINUED] omeprazole (PRILOSEC) 40 MG capsule Take 1 capsule (40 mg total) by mouth in the morning and at bedtime.   No facility-administered encounter medications on file as of 05/25/2022.    No Known Allergies  Review of Systems  Constitutional:  Negative for activity change, appetite change, chills, diaphoresis, fatigue, fever and unexpected weight change.  HENT: Negative.    Eyes: Negative.   Respiratory:  Negative for cough, chest tightness and shortness of breath.   Cardiovascular:  Negative for chest pain, palpitations and leg swelling.  Gastrointestinal:  Negative for abdominal pain, blood in stool, constipation, diarrhea, nausea and vomiting.  Endocrine: Negative.   Genitourinary:  Negative for dysuria, frequency and urgency.  Musculoskeletal:  Negative for arthralgias and myalgias.  Skin:  Positive for color change.  Allergic/Immunologic: Negative.   Neurological:  Negative for dizziness, weakness and headaches.  Hematological: Negative.   Psychiatric/Behavioral:  Negative for confusion,  hallucinations, sleep disturbance and suicidal ideas.   All other systems reviewed and are negative.       Objective:  BP 138/77   Pulse 97   Temp 98.7 F (37.1 C)   Ht 5\' 10"  (1.778 m)   Wt 234 lb (106.1 kg)   SpO2 95%   BMI 33.58 kg/m    Wt Readings from Last 3 Encounters:  05/25/22 234 lb (106.1 kg)  10/02/21 231 lb (104.8 kg)  09/29/21 231 lb (104.8 kg)    Physical Exam Vitals and nursing note reviewed.  Constitutional:      General: He is not in acute distress.    Appearance: Normal appearance. He  is obese. He is not ill-appearing, toxic-appearing or diaphoretic.  HENT:     Head: Normocephalic and atraumatic.     Nose: Nose normal.     Mouth/Throat:     Mouth: Mucous membranes are moist.  Eyes:     Pupils: Pupils are equal, round, and reactive to light.  Cardiovascular:     Rate and Rhythm: Normal rate and regular rhythm.     Heart sounds: Normal heart sounds.  Pulmonary:     Effort: Pulmonary effort is normal.     Breath sounds: Normal breath sounds.  Skin:    General: Skin is warm and dry.     Capillary Refill: Capillary refill takes less than 2 seconds.     Findings: Abscess and erythema present.       Neurological:     General: No focal deficit present.     Mental Status: He is alert and oriented to person, place, and time.  Psychiatric:        Mood and Affect: Mood normal.        Behavior: Behavior normal.        Thought Content: Thought content normal.        Judgment: Judgment normal.     Results for orders placed or performed in visit on 09/29/21  Comprehensive metabolic panel  Result Value Ref Range   Sodium 138 135 - 145 mEq/L   Potassium 3.8 3.5 - 5.1 mEq/L   Chloride 103 96 - 112 mEq/L   CO2 26 19 - 32 mEq/L   Glucose, Bld 94 70 - 99 mg/dL   BUN 13 6 - 23 mg/dL   Creatinine, Ser 11/29/21 0.40 - 1.50 mg/dL   Total Bilirubin 0.7 0.2 - 1.2 mg/dL   Alkaline Phosphatase 62 39 - 117 U/L   AST 21 0 - 37 U/L   ALT 33 0 - 53 U/L   Total Protein 7.4 6.0 - 8.3 g/dL   Albumin 4.6 3.5 - 5.2 g/dL   GFR 1.61 096.04 mL/min   Calcium 9.5 8.4 - 10.5 mg/dL  CBC with Differential/Platelet  Result Value Ref Range   WBC 7.7 4.0 - 10.5 K/uL   RBC 4.84 4.22 - 5.81 Mil/uL   Hemoglobin 15.7 13.0 - 17.0 g/dL   HCT >54.09 81.1 - 91.4 %   MCV 91.1 78.0 - 100.0 fl   MCHC 35.6 30.0 - 36.0 g/dL   RDW 78.2 95.6 - 21.3 %   Platelets 192.0 150.0 - 400.0 K/uL   Neutrophils Relative % 57.5 43.0 - 77.0 %   Lymphocytes Relative 27.9 12.0 - 46.0 %   Monocytes Relative 6.6 3.0 -  12.0 %   Eosinophils Relative 6.7 (H) 0.0 - 5.0 %   Basophils Relative 1.3 0.0 - 3.0 %   Neutro Abs 4.4 1.4 -  7.7 K/uL   Lymphs Abs 2.1 0.7 - 4.0 K/uL   Monocytes Absolute 0.5 0.1 - 1.0 K/uL   Eosinophils Absolute 0.5 0.0 - 0.7 K/uL   Basophils Absolute 0.1 0.0 - 0.1 K/uL    I & D  Date/Time: 05/25/2022 9:15 AM  Performed by: Baruch Gouty, FNP Authorized by: Baruch Gouty, FNP   Consent:    Consent obtained:  Verbal   Consent given by:  Patient   Risks, benefits, and alternatives were discussed: yes     Risks discussed:  Bleeding, incomplete drainage, pain and infection   Alternatives discussed:  No treatment, delayed treatment, alternative treatment, observation and referral Universal protocol:    Procedure explained and questions answered to patient or proxy's satisfaction: yes     Relevant documents present and verified: yes     Immediately prior to procedure a time out was called: yes     Patient identity confirmed:  Verbally with patient Location:    Type:  Abscess   Size:  5 cm   Location:  Neck   Neck location:  R anterior Pre-procedure details:    Skin preparation:  Alcohol and povidone-iodine Sedation:    Sedation type:  None Anesthesia:    Anesthesia method:  Topical application Procedure type:    Complexity:  Simple Procedure details:    Needle aspiration: no     Incision types:  Single straight   Scalpel blade:  11   Wound management:  Probed and deloculated and extensive cleaning   Drainage:  Purulent and bloody   Drainage amount:  Moderate   Wound treatment:  Wound left open   Packing materials:  None Post-procedure details:    Procedure completion:  Tolerated well, no immediate complications    Pertinent labs & imaging results that were available during my care of the patient were reviewed by me and considered in my medical decision making.  Assessment & Plan:  Charles Greene was seen today for skin check.  Diagnoses and all orders for this  visit:  Abscess Exposure to MRSA Drained in office, tolerated well. Wound care discussed in detail. Follow up in one week for reevaluation. Medications as prescribed. Report new, worsening, or persistent symptoms.  -     sulfamethoxazole-trimethoprim (BACTRIM DS) 800-160 MG tablet; Take 1 tablet by mouth 2 (two) times daily for 10 days. -     I & D     Continue all other maintenance medications.  Follow up plan: Return in about 1 week (around 06/01/2022), or if symptoms worsen or fail to improve.   Continue healthy lifestyle choices, including diet (rich in fruits, vegetables, and lean proteins, and low in salt and simple carbohydrates) and exercise (at least 30 minutes of moderate physical activity daily).  Educational handout given for skin abscess  The above assessment and management plan was discussed with the patient. The patient verbalized understanding of and has agreed to the management plan. Patient is aware to call the clinic if they develop any new symptoms or if symptoms persist or worsen. Patient is aware when to return to the clinic for a follow-up visit. Patient educated on when it is appropriate to go to the emergency department.   Monia Pouch, FNP-C [facility] [phone]

## 2022-06-21 DIAGNOSIS — H6533 Chronic mucoid otitis media, bilateral: Secondary | ICD-10-CM | POA: Diagnosis not present

## 2022-06-21 DIAGNOSIS — H6122 Impacted cerumen, left ear: Secondary | ICD-10-CM | POA: Diagnosis not present

## 2022-06-21 DIAGNOSIS — H906 Mixed conductive and sensorineural hearing loss, bilateral: Secondary | ICD-10-CM | POA: Diagnosis not present

## 2022-07-05 DIAGNOSIS — H6983 Other specified disorders of Eustachian tube, bilateral: Secondary | ICD-10-CM | POA: Diagnosis not present

## 2022-08-02 ENCOUNTER — Emergency Department (HOSPITAL_BASED_OUTPATIENT_CLINIC_OR_DEPARTMENT_OTHER): Payer: BC Managed Care – PPO | Admitting: Radiology

## 2022-08-02 ENCOUNTER — Other Ambulatory Visit: Payer: Self-pay

## 2022-08-02 ENCOUNTER — Emergency Department (HOSPITAL_BASED_OUTPATIENT_CLINIC_OR_DEPARTMENT_OTHER)
Admission: EM | Admit: 2022-08-02 | Discharge: 2022-08-02 | Disposition: A | Discharge status: Home / Self Care | Payer: BC Managed Care – PPO | Attending: Emergency Medicine | Admitting: Emergency Medicine

## 2022-08-02 ENCOUNTER — Encounter (HOSPITAL_BASED_OUTPATIENT_CLINIC_OR_DEPARTMENT_OTHER): Payer: Self-pay | Admitting: Emergency Medicine

## 2022-08-02 DIAGNOSIS — M79602 Pain in left arm: Secondary | ICD-10-CM | POA: Diagnosis not present

## 2022-08-02 DIAGNOSIS — R079 Chest pain, unspecified: Secondary | ICD-10-CM

## 2022-08-02 DIAGNOSIS — R0789 Other chest pain: Secondary | ICD-10-CM | POA: Diagnosis not present

## 2022-08-02 LAB — BASIC METABOLIC PANEL
Anion gap: 10 (ref 5–15)
BUN: 12 mg/dL (ref 6–20)
CO2: 26 mmol/L (ref 22–32)
Calcium: 9.9 mg/dL (ref 8.9–10.3)
Chloride: 103 mmol/L (ref 98–111)
Creatinine, Ser: 1.12 mg/dL (ref 0.61–1.24)
GFR, Estimated: >60 mL/min (ref >60)
Glucose, Bld: 107 mg/dL — ABNORMAL HIGH (ref 70–99)
Potassium: 3.7 mmol/L (ref 3.5–5.1)
Sodium: 139 mmol/L (ref 135–145)

## 2022-08-02 LAB — CBC
HCT: 46 % (ref 39.0–52.0)
Hemoglobin: 16.4 g/dL (ref 13.0–17.0)
MCH: 32.3 pg (ref 26.0–34.0)
MCHC: 35.7 g/dL (ref 30.0–36.0)
MCV: 90.7 fL (ref 80.0–100.0)
Platelets: 219 10*3/uL (ref 150–400)
RBC: 5.07 MIL/uL (ref 4.22–5.81)
RDW: 12.3 % (ref 11.5–15.5)
WBC: 8 10*3/uL (ref 4.0–10.5)
nRBC: 0 % (ref 0.0–0.2)

## 2022-08-02 LAB — TROPONIN I (HIGH SENSITIVITY)
Troponin I (High Sensitivity): <2 ng/L (ref <18)
Troponin I (High Sensitivity): <2 ng/L (ref <18)

## 2022-08-02 NOTE — ED Provider Notes (Signed)
[facility] EMERGENCY DEPT Provider Note   CSN: [id number] Arrival date & time: 08/02/22  1309     History  Chief Complaint  Patient presents with   Chest Pain    Charles Greene is a 27 y.o. male.  He has no chronic medical conditions.  Presents the ER complaining of chest pain that is present on the past 2 weeks.  He states over the past 2 days he had some pain in the left bicep area as well which caused him to be more concerned that it could be related to his heart and wanted to be evaluated.  He denies shortness of breath or pleuritic pain, no cough, no fevers or chills, no hemoptysis.  No leg pain or swelling.  He denies any personal or family history of cardiac disease.  He dates that the pain is better when he is doing something else and is distracted when he is laying down to bed tonight he notices the pain.  He has no exertional symptoms.   Chest Pain      Home Medications Prior to Admission medications   Not on File      Allergies    Patient has no known allergies.    Review of Systems   Review of Systems  Cardiovascular:  Positive for chest pain.    Physical Exam Updated Vital Signs BP 128/75   Pulse 75   Temp 98.5 F (36.9 C)   Resp 20   Ht 5\' 10"  (1.778 m)   Wt 104.3 kg   SpO2 99%   BMI 33.00 kg/m  Physical Exam  ED Results / Procedures / Treatments   Labs (all labs ordered are listed, but only abnormal results are displayed) Labs Reviewed  BASIC METABOLIC PANEL - Abnormal; Notable for the following components:      Result Value   Glucose, Bld 107 (*)    All other components within normal limits  CBC  TROPONIN I (HIGH SENSITIVITY)  TROPONIN I (HIGH SENSITIVITY)    EKG EKG Interpretation  Date/Time:  Monday August 02 2022 13:16:21 EST Ventricular Rate:  97 PR Interval:  136 QRS Duration: 82 QT Interval:  358 QTC Calculation: 454 R Axis:   87 Text Interpretation: Normal sinus rhythm with sinus arrhythmia Normal ECG  When compared with ECG of 31-May-2016 18:51, No significant change was found Confirmed by 02-Jun-2016 [id number]) on 08/02/2022 3:32:35 PM  Radiology DG Chest 2 View  Result Date: 08/02/2022 CLINICAL DATA:  Chest pain for a couple of weeks. Left arm pain started yesterday EXAM: CHEST - 2 VIEW COMPARISON:  None Available. FINDINGS: The heart size and mediastinal contours are within normal limits. Both lungs are clear. The visualized skeletal structures are unremarkable. Air-fluid level along the stomach beneath the left hemidiaphragm. IMPRESSION: No acute cardiopulmonary disease Electronically Signed   By: 10/01/2022 M.D.   On: 08/02/2022 13:33    Procedures Procedures    Medications Ordered in ED Medications - No data to display  ED Course/ Medical Decision Making/ A&P                           Medical Decision Making This patient presents to the ED for concern of chest pain, this involves an extensive number of treatment options, and is a complaint that carries with it a high risk of complications and morbidity.  The differential diagnosis includes ACS, pneumonia, PE, pericarditis, dissection, anxiety, costochondritis, muscle strain, other  Co morbidities that complicate the patient evaluation  None   Additional history obtained:  Additional history obtained from EMR External records from outside source obtained and reviewed including outpatient family medicine notes   Lab Tests:  I Ordered, and personally interpreted labs.  The pertinent results include: CBC BMP troponin   Imaging Studies ordered:  I ordered imaging studies including chest x-ray I independently visualized and interpreted imaging which showed edema or infiltrate I agree with the radiologist interpretation   Cardiac Monitoring: / EKG:  The patient was maintained on a cardiac monitor.  I personally viewed and interpreted the cardiac monitored which showed an underlying rhythm of: Sinus  rhythm     Problem List / ED Course / Critical interventions / Medication management  Chest pain-patient has chest pain for several weeks with very reassuring workup including negative troponins x 2, reassuring ECG, normal vitals, he is low risk Wells score and negative for all PERC criteria.  He states he only notices the pain when he is lying down to bed or not distracted.  He in fact states that currently the IV catheter is arm is more bothersome than his chest pain.  His history and ECG are not concerning for pericarditis.  He has no mediastinal widening to suggest a dissection and his clinical picture does not fit this either.  He is a young and otherwise healthy with no significant cardiac risk factors and no significant family history.  I do not feel he needs any further workup.  Discussed with him that he should follow-up with cardiology and they may want to do echo or further workup if he continues having symptoms and he should come back to the ER for any new or worsening symptoms.  I have reviewed the patients home medicines and have made adjustments as needed       Amount and/or Complexity of Data Reviewed Labs: ordered. Radiology: ordered.   Sinus rhythm NS rhythmsinus        Final Clinical Impression(s) / ED Diagnoses Final diagnoses:  Chest pain, unspecified type    Rx / DC Orders ED Discharge Orders     None         Gwenevere Abbot, PA-C 08/02/22 1619    Lorelle Gibbs, DO 08/03/22 0715

## 2022-08-02 NOTE — Discharge Instructions (Signed)
You are seen today for chest pain, your workup was reassuring.  If you have worsening pain, trouble breathing, become sweaty or dizzy with the pain, pass out, have a very fast or very slow heart rate or any other worrisome changes come back right away.  Otherwise follow-up with your primary care and cardiology as an outpatient.

## 2022-08-02 NOTE — ED Triage Notes (Addendum)
Pt arrives to ED with c/o chest pains for "couple of weeks" and left arm pain that started yesterday. Notes cocaine use during weekends.

## 2022-08-12 DIAGNOSIS — H6993 Unspecified Eustachian tube disorder, bilateral: Secondary | ICD-10-CM | POA: Diagnosis not present

## 2022-08-12 DIAGNOSIS — Z9622 Myringotomy tube(s) status: Secondary | ICD-10-CM | POA: Diagnosis not present

## 2022-11-15 DIAGNOSIS — J0100 Acute maxillary sinusitis, unspecified: Secondary | ICD-10-CM | POA: Diagnosis not present

## 2023-06-06 ENCOUNTER — Ambulatory Visit: Payer: Self-pay | Admitting: Family Medicine

## 2023-06-06 NOTE — Telephone Encounter (Signed)
  Chief Complaint: foreign body in eye Symptoms: metal stuck in eye, blurry vision, pain, redness of eye Pertinent Negatives: Patient denies LOC Disposition: [x] ED /[] Urgent Care (no appt availability in office) / [] Appointment(In office/virtual)/ []  [facility]/ [] Home Care/ [] Refused Recommended Disposition /[] [place] [facility]/ []  Follow-up with PCP Additional Notes: Patient's wife called in and stated patient was at work when a piece of metal became embedded into one of his eyes. Patients wife reports that patient is currently driving to PCP office to receive care. Patient's wife reports patient is experiencing blurry vision, redness of eye, pain, and reports that patient has so far been unable to remove the piece of metal from his eye. Per protocol, this RN recommended patient go to the ED. This RN also advised that patient should pull over and have someone else bring him to the ED if his vision is blurry. Wife verbalized understanding.  Copied from CRM [id number]. Topic: Clinical - Red Word Triage >> Jun 06, 2023  2:20 PM Raven B wrote: Red Word that prompted transfer to Nurse Triage: PT wife advised husband is headed to practice due to metal in his eye. Requested to speak to nurse. Reason for Disposition  Foreign body (FB) stuck on eyeball  (Exception: Contact lens.)  Answer Assessment - Initial Assessment Questions 1. TYPE OF FOREIGN BODY: "What got in the eye?"      A piece of metal 2. ONSET: "When did it happen?"      Minutes ago 3. MECHANISM: "How did it happen?"      Working on truck and metal broke off into his eye 4. VISION: "Do you have blurred vision?"      Blurry vision 5. PAIN: "Is it painful?" If Yes, ask: "How bad is the pain?"  (Scale 1-10; or mild, moderate, severe)     moderate 6. CONTACTS: "Do you wear contacts?"     no 7. OTHER SYMPTOMS: "Do you have any other symptoms?"     redness  Protocols used: Eye - Foreign Body-A-AH
